# Patient Record
Sex: Male | Born: 1960 | Race: White | Hispanic: No | Smoking: Current every day smoker
Health system: Southern US, Community
[De-identification: ages and names within clinical notes are randomized; demographics above are authoritative.]

## PROBLEM LIST (undated history)

## (undated) ENCOUNTER — Emergency Department (HOSPITAL_BASED_OUTPATIENT_CLINIC_OR_DEPARTMENT_OTHER): Admission: EM | Payer: Non-veteran care | Source: Home / Self Care

## (undated) DIAGNOSIS — I10 Essential (primary) hypertension: Secondary | ICD-10-CM

## (undated) DIAGNOSIS — L509 Urticaria, unspecified: Secondary | ICD-10-CM

## (undated) HISTORY — DX: Urticaria, unspecified: L50.9

---

## 2016-01-30 ENCOUNTER — Emergency Department (HOSPITAL_BASED_OUTPATIENT_CLINIC_OR_DEPARTMENT_OTHER): Payer: Non-veteran care

## 2016-01-30 ENCOUNTER — Emergency Department (HOSPITAL_BASED_OUTPATIENT_CLINIC_OR_DEPARTMENT_OTHER)
Admission: EM | Admit: 2016-01-30 | Discharge: 2016-01-30 | Disposition: A | Discharge status: Home / Self Care | Payer: Non-veteran care | Attending: Emergency Medicine | Admitting: Emergency Medicine

## 2016-01-30 ENCOUNTER — Encounter (HOSPITAL_BASED_OUTPATIENT_CLINIC_OR_DEPARTMENT_OTHER): Payer: Self-pay | Admitting: *Deleted

## 2016-01-30 DIAGNOSIS — R0981 Nasal congestion: Secondary | ICD-10-CM

## 2016-01-30 DIAGNOSIS — R05 Cough: Secondary | ICD-10-CM | POA: Diagnosis present

## 2016-01-30 DIAGNOSIS — R059 Cough, unspecified: Secondary | ICD-10-CM

## 2016-01-30 DIAGNOSIS — I10 Essential (primary) hypertension: Secondary | ICD-10-CM | POA: Diagnosis not present

## 2016-01-30 DIAGNOSIS — R197 Diarrhea, unspecified: Secondary | ICD-10-CM | POA: Insufficient documentation

## 2016-01-30 DIAGNOSIS — R61 Generalized hyperhidrosis: Secondary | ICD-10-CM | POA: Insufficient documentation

## 2016-01-30 DIAGNOSIS — Z79899 Other long term (current) drug therapy: Secondary | ICD-10-CM | POA: Insufficient documentation

## 2016-01-30 DIAGNOSIS — J029 Acute pharyngitis, unspecified: Secondary | ICD-10-CM | POA: Diagnosis not present

## 2016-01-30 DIAGNOSIS — R42 Dizziness and giddiness: Secondary | ICD-10-CM | POA: Diagnosis not present

## 2016-01-30 HISTORY — DX: Essential (primary) hypertension: I10

## 2016-01-30 MED ORDER — BENZONATATE 100 MG PO CAPS
100.0000 mg | ORAL_CAPSULE | Freq: Once | ORAL | Status: AC
  Administered 2016-01-30: 100 mg via ORAL
  Filled 2016-01-30: qty 1

## 2016-01-30 MED ORDER — IBUPROFEN 800 MG PO TABS: 800.0000 mg | ORAL_TABLET | Freq: Three times a day (TID) | ORAL | Status: DC

## 2016-01-30 MED ORDER — BENZONATATE 100 MG PO CAPS: 100.0000 mg | ORAL_CAPSULE | Freq: Three times a day (TID) | ORAL | Status: DC

## 2016-01-30 MED ORDER — IBUPROFEN 800 MG PO TABS
800.0000 mg | ORAL_TABLET | Freq: Once | ORAL | Status: AC
  Administered 2016-01-30: 800 mg via ORAL
  Filled 2016-01-30: qty 1

## 2016-01-30 MED FILL — IBUPROFEN 800 MG TABLET: 800 | 7 days supply | Qty: 21 | Fill #0

## 2016-01-30 MED FILL — BENZONATATE 100 MG CAPSULE: 100 | 7 days supply | Qty: 21 | Fill #0

## 2016-01-30 NOTE — ED Provider Notes (Signed)
CSN: [id number]     Arrival date & time 01/30/16  3086 History   First MD Initiated Contact with Patient 01/30/16 0933     No chief complaint on file.    (Consider location/radiation/quality/duration/timing/severity/associated sxs/prior Treatment) HPI   Chase Romero is a 55 y.o. male, patient with no pertinent past medical history, presenting to the ED with productive cough with green and yellow sputum, sinus congestion, and sore throat for the last 2-3 days. Patient also endorses subjective fever, intermittent diarrhea, and momentary dizziness with coughing. Patient denies taking any medications for his symptoms. Later in the interview, patient then adds that he has had cough and congestion since October 2016. Patient adds that his coughing seems to get worse when he drinks alcohol. Patient denies nausea/vomiting, chest pain, shortness of breath, abdominal pain, or any other complaints.    Past Medical History  Diagnosis Date  . Hypertension    History reviewed. No pertinent past surgical history. No family history on file. Social History  Substance Use Topics  . Smoking status: Never Smoker   . Smokeless tobacco: None  . Alcohol Use: None    Review of Systems  Constitutional: Positive for fever (subjective) and diaphoresis. Negative for chills.  Respiratory: Positive for cough. Negative for shortness of breath.   Cardiovascular: Negative for chest pain.  Gastrointestinal: Positive for diarrhea. Negative for nausea, vomiting, abdominal pain and blood in stool.  Genitourinary: Negative for dysuria and hematuria.  Musculoskeletal: Negative for myalgias.  Skin: Negative for color change and pallor.  All other systems reviewed and are negative.     Allergies  Review of patient's allergies indicates no known allergies.  Home Medications   Prior to Admission medications   Medication Sig Start Date End Date Taking? Authorizing Provider  lisinopril (PRINIVIL,ZESTRIL) 20 MG  tablet Take 20 mg by mouth daily.   Yes Historical Provider, MD  benzonatate (TESSALON) 100 MG capsule Take 1 capsule (100 mg total) by mouth every 8 (eight) hours. 01/30/16   Jenay Morici C Brody Kump, PA-C  ibuprofen (ADVIL,MOTRIN) 800 MG tablet Take 1 tablet (800 mg total) by mouth 3 (three) times daily. 01/30/16   Osbaldo Mark C Donatella Walski, PA-C   BP 143/103 mmHg  Pulse 76  Temp(Src) 97.8 F (36.6 C) (Oral)  Resp 16  Ht  (1.778 m)  Wt 104.327 kg  BMI 33.00 kg/m2  SpO2 96% Physical Exam  Constitutional: He appears well-developed and well-nourished. No distress.  HENT:  Head: Normocephalic and atraumatic.  Mouth/Throat: Uvula is midline, oropharynx is clear and moist and mucous membranes are normal. No oropharyngeal exudate, posterior oropharyngeal edema, posterior oropharyngeal erythema or tonsillar abscesses.  Eyes: Conjunctivae are normal. Pupils are equal, round, and reactive to light.  Neck: Neck supple.  Cardiovascular: Normal rate, regular rhythm, normal heart sounds and intact distal pulses.   Pulmonary/Chest: Effort normal and breath sounds normal. No respiratory distress.  Abdominal: Soft. Bowel sounds are normal. There is no tenderness. There is no guarding.  Musculoskeletal: He exhibits no edema or tenderness.  Lymphadenopathy:    He has no cervical adenopathy.  Neurological: He is alert.  Skin: Skin is warm and dry. He is not diaphoretic.  Psychiatric: He has a normal mood and affect. His behavior is normal.  Nursing note and vitals reviewed.   ED Course  Procedures (including critical care time)  Imaging Review Dg Chest 2 View  01/30/2016  CLINICAL DATA:  Sweats and possible fever for 4 days. Cough since October, 2016. Initial encounter. EXAM:  CHEST  2 VIEW COMPARISON:  None. FINDINGS: The lungs are clear. Heart size is normal. There is no pneumothorax or pleural effusion. Thoracic spondylosis is noted. IMPRESSION: No acute disease. Electronically Signed   By: Drusilla Kannerhomas  Dalessio M.D.   On:  01/30/2016 10:15   I have personally reviewed and evaluated these images as part of my medical decision-making.   EKG Interpretation None      Orthostatic VS for the past 24 hrs:  BP- Lying Pulse- Lying BP- Sitting Pulse- Sitting BP- Standing at 0 minutes Pulse- Standing at 0 minutes  01/30/16 0959 (!) 122/98 mmHg 72 (!) 135/96 mmHg 80 125/86 mmHg 84      MDM   Final diagnoses:  Cough  Sinus congestion  Sore throat    Chase Romero presents with productive cough, congestion, sore throat, and subjective fever for the last 2-3 days.  Patient's presentation and symptoms are consistent with viral illness, such as influenza or an upper respiratory infection, exacerbated by seasonal allergies. Patient has no signs of sepsis or other serious illness. Patient has no red flag symptoms. No orthostasis. Patient to follow up with PCP should symptoms continue. Home care and return precautions discussed. Patient voiced understanding of these instructions and is comfortable with discharge.   Filed Vitals:   01/30/16 0932  BP: 143/103  Pulse: 76  Temp: 97.8 F (36.6 C)  TempSrc: Oral  Resp: 16  Height: 5\' 10"  (1.778 m)  Weight: 104.327 kg  SpO2: 96%      Anselm PancoastShawn C Lanita Stammen, PA-C 01/30/16 1706  Geoffery Lyonsouglas Delo, MD 01/31/16 [phone]

## 2016-01-30 NOTE — ED Notes (Signed)
C/o cough with green sputum since Friday with sweats and congestion  In head with sorethroat.

## 2016-01-30 NOTE — ED Notes (Signed)
Patient asked to change into gown. 

## 2016-01-30 NOTE — ED Notes (Signed)
Patient ambulates without difficulty or needing assistance to and from radiology department.

## 2016-01-30 NOTE — Discharge Instructions (Signed)
You have been seen today for cough, congestion, and sore throat. Your imaging showed no abnormalities. Your symptoms are consistent with a viral illness. Viruses do not require antibiotics. Treatment is symptomatic care. Drink plenty of fluids and get plenty of rest. You should be drinking at least a liter of water an hour to stay hydrated. Ibuprofen or Tylenol for pain or fever. Tessalon for cough. Plain Mucinex may help relieve congestion. Warm liquids or Chloraseptic spray may help soothe the sore throat. Follow up with PCP as needed. Return to ED should symptoms worsen.

## 2018-07-23 ENCOUNTER — Other Ambulatory Visit: Payer: Self-pay

## 2018-07-23 ENCOUNTER — Encounter (HOSPITAL_BASED_OUTPATIENT_CLINIC_OR_DEPARTMENT_OTHER): Payer: Self-pay | Admitting: *Deleted

## 2018-07-23 ENCOUNTER — Emergency Department (HOSPITAL_BASED_OUTPATIENT_CLINIC_OR_DEPARTMENT_OTHER)
Admission: EM | Admit: 2018-07-23 | Discharge: 2018-07-23 | Disposition: A | Discharge status: Home / Self Care | Payer: Non-veteran care | Attending: Emergency Medicine | Admitting: Emergency Medicine

## 2018-07-23 DIAGNOSIS — F17210 Nicotine dependence, cigarettes, uncomplicated: Secondary | ICD-10-CM | POA: Insufficient documentation

## 2018-07-23 DIAGNOSIS — Y929 Unspecified place or not applicable: Secondary | ICD-10-CM | POA: Insufficient documentation

## 2018-07-23 DIAGNOSIS — Y990 Civilian activity done for income or pay: Secondary | ICD-10-CM | POA: Insufficient documentation

## 2018-07-23 DIAGNOSIS — S0993XA Unspecified injury of face, initial encounter: Secondary | ICD-10-CM | POA: Diagnosis present

## 2018-07-23 DIAGNOSIS — S0502XA Injury of conjunctiva and corneal abrasion without foreign body, left eye, initial encounter: Secondary | ICD-10-CM | POA: Diagnosis not present

## 2018-07-23 DIAGNOSIS — I10 Essential (primary) hypertension: Secondary | ICD-10-CM | POA: Diagnosis not present

## 2018-07-23 DIAGNOSIS — W228XXA Striking against or struck by other objects, initial encounter: Secondary | ICD-10-CM | POA: Diagnosis not present

## 2018-07-23 DIAGNOSIS — Y939 Activity, unspecified: Secondary | ICD-10-CM | POA: Insufficient documentation

## 2018-07-23 DIAGNOSIS — Z79899 Other long term (current) drug therapy: Secondary | ICD-10-CM | POA: Insufficient documentation

## 2018-07-23 MED ORDER — CIPROFLOXACIN HCL 0.3 % OP SOLN: 2.0000 [drp] | OPHTHALMIC | 0 refills | Status: DC

## 2018-07-23 MED ORDER — TETRACAINE HCL 0.5 % OP SOLN
2.0000 [drp] | Freq: Once | OPHTHALMIC | Status: AC
  Administered 2018-07-23: 2 [drp] via OPHTHALMIC
  Filled 2018-07-23: qty 4

## 2018-07-23 MED ORDER — FLUORESCEIN SODIUM 1 MG OP STRP
1.0000 | ORAL_STRIP | Freq: Once | OPHTHALMIC | Status: AC
  Administered 2018-07-23: 1 via OPHTHALMIC
  Filled 2018-07-23: qty 1

## 2018-07-23 NOTE — ED Triage Notes (Signed)
Pt reports he got something in his eye while working today. Thinks it may be sawdust or metal shavings. C/o left eye pain and fb sensation since 5pm

## 2018-07-23 NOTE — ED Provider Notes (Signed)
[facility] EMERGENCY DEPARTMENT Provider Note   CSN: [id number] Arrival date & time: 07/23/18  0005     History   Chief Complaint Chief Complaint  Patient presents with  . Eye Pain    HPI Chase Romero is a 57 y.o. male.  The history is provided by the patient.  Eye Pain  This is a new problem. The current episode started 12 to 24 hours ago. The problem occurs constantly. The problem has not changed since onset.Pertinent negatives include no chest pain, no abdominal pain, no headaches and no shortness of breath. Nothing aggravates the symptoms. Nothing relieves the symptoms. He has tried nothing for the symptoms. The treatment provided no relief.  Something flew into his left eye at work.  Has flushed the eye with water but symptoms persist.  No f/c/r. No swelling no discharge.    Past Medical History:  Diagnosis Date  . Hypertension     There are no active problems to display for this patient.   History reviewed. No pertinent surgical history.      Home Medications    Prior to Admission medications   Medication Sig Start Date End Date Taking? Authorizing Provider  benzonatate (TESSALON) 100 MG capsule Take 1 capsule (100 mg total) by mouth every 8 (eight) hours. 01/30/16   Joy, Shawn C, PA-C  ciprofloxacin (CILOXAN) 0.3 % ophthalmic solution Place 2 drops into the left eye every 4 (four) hours while awake. Administer 1 drop, every 2 hours, while awake, for 2 days. Then 1 drop, every 4 hours, while awake, for the next 5 days. 07/23/18   Ailen Strauch, MD  ibuprofen (ADVIL,MOTRIN) 800 MG tablet Take 1 tablet (800 mg total) by mouth 3 (three) times daily. 01/30/16   Joy, Shawn C, PA-C  lisinopril (PRINIVIL,ZESTRIL) 20 MG tablet Take 20 mg by mouth daily.    [provider]    Family History No family history on file.  Social History Social History   Tobacco Use  . Smoking status: Current Every Day Smoker    Types: Cigarettes  . Smokeless  tobacco: Never Used  Substance Use Topics  . Alcohol use: Never    Frequency: Never  . Drug use: Never     Allergies   Patient has no known allergies.   Review of Systems Review of Systems  Constitutional: Negative for fever.  Eyes: Positive for pain, redness and itching. Negative for visual disturbance.  Respiratory: Negative for shortness of breath.   Cardiovascular: Negative for chest pain.  Gastrointestinal: Negative for abdominal pain.  Genitourinary: Negative for flank pain.  Neurological: Negative for headaches.  All other systems reviewed and are negative.    Physical Exam Updated Vital Signs BP (!) 139/93 (BP Location: Left Arm)   Pulse 90   Temp 97.9 F (36.6 C) (Oral)   Resp 16   Ht 5\' 10"  (1.778 m)   Wt 102.1 kg   SpO2 99%   BMI 32.28 kg/m   Physical Exam  Constitutional: He is oriented to person, place, and time. He appears well-developed and well-nourished. No distress.  HENT:  Head: Normocephalic and atraumatic.  Eyes: Pupils are equal, round, and reactive to light. EOM are normal. Left eye exhibits no chemosis, no discharge and no exudate. Left conjunctiva is injected.    Neck: Normal range of motion. Neck supple.  Cardiovascular: Normal rate, regular rhythm, normal heart sounds and intact distal pulses.  Pulmonary/Chest: Effort normal and breath sounds normal. No stridor. He has no  wheezes. He has no rales.  Abdominal: Soft. Bowel sounds are normal.  Musculoskeletal: Normal range of motion.  Neurological: He is alert and oriented to person, place, and time.  Skin: Skin is warm and dry. Capillary refill takes less than 2 seconds.     ED Treatments / Results  Labs (all labs ordered are listed, but only abnormal results are displayed) Labs Reviewed - No data to display  EKG None  Radiology No results found.  Procedures Procedures (including critical care time)  Medications Ordered in ED Medications  fluorescein ophthalmic strip 1  strip (1 strip Right Eye Given by Other 07/23/18 0024)  tetracaine (PONTOCAINE) 0.5 % ophthalmic solution 2 drop (2 drops Right Eye Given by Other 07/23/18 0025)       Visual Acuity  Right Eye Distance: 20/25 Left Eye Distance: 20/30 Bilateral Distance: 20/25  Right Eye Near:   Left Eye Near:    Bilateral Near:     Final Clinical Impressions(s) / ED Diagnoses   Final diagnoses:  Abrasion of left cornea, initial encounter    Return for weakness, numbness, changes in vision or speech, fevers >100.4 unrelieved by medication, shortness of breath, intractable vomiting, or diarrhea, abdominal pain, Inability to tolerate liquids or food, cough, altered mental status or any concerns. No signs of systemic illness or infection. The patient is nontoxic-appearing on exam and vital signs are within normal limits.    I have reviewed the triage vital signs and the nursing notes. Pertinent labs &imaging results that were available during my care of the patient were reviewed by me and considered in my medical decision making (see chart for details).  After history, exam, and medical workup I feel the patient has been appropriately medically screened and is safe for discharge home. Pertinent diagnoses were discussed with the patient. Patient was given return precautions. ED Discharge Orders         Ordered    ciprofloxacin (CILOXAN) 0.3 % ophthalmic solution  Every 4 hours while awake     07/23/18 0025           Nechama Escutia, MD 07/23/18 1610

## 2018-07-23 NOTE — ED Notes (Signed)
ED Provider at bedside. 

## 2020-09-28 ENCOUNTER — Encounter (HOSPITAL_COMMUNITY): Payer: Self-pay | Admitting: Emergency Medicine

## 2020-09-28 ENCOUNTER — Ambulatory Visit (INDEPENDENT_AMBULATORY_CARE_PROVIDER_SITE_OTHER): Payer: Non-veteran care

## 2020-09-28 ENCOUNTER — Ambulatory Visit (HOSPITAL_COMMUNITY)
Admission: EM | Admit: 2020-09-28 | Discharge: 2020-09-28 | Disposition: A | Discharge status: Home / Self Care | Payer: Non-veteran care | Attending: Emergency Medicine | Admitting: Emergency Medicine

## 2020-09-28 DIAGNOSIS — M5441 Lumbago with sciatica, right side: Secondary | ICD-10-CM

## 2020-09-28 DIAGNOSIS — R202 Paresthesia of skin: Secondary | ICD-10-CM | POA: Diagnosis not present

## 2020-09-28 DIAGNOSIS — M549 Dorsalgia, unspecified: Secondary | ICD-10-CM

## 2020-09-28 DIAGNOSIS — M25551 Pain in right hip: Secondary | ICD-10-CM

## 2020-09-28 DIAGNOSIS — M5136 Other intervertebral disc degeneration, lumbar region: Secondary | ICD-10-CM

## 2020-09-28 MED ORDER — PREDNISONE 20 MG PO TABS
ORAL_TABLET | ORAL | Status: AC
Start: 2020-09-28 — End: ?
  Filled 2020-09-28: qty 3

## 2020-09-28 MED ORDER — PREDNISONE 20 MG PO TABS
60.0000 mg | ORAL_TABLET | Freq: Once | ORAL | Status: AC
  Administered 2020-09-28: 60 mg via ORAL

## 2020-09-28 MED ORDER — IBUPROFEN 600 MG PO TABS: 600.0000 mg | ORAL_TABLET | Freq: Four times a day (QID) | ORAL | 0 refills | Status: DC | PRN

## 2020-09-28 MED ORDER — ACETAMINOPHEN 325 MG PO TABS
975.0000 mg | ORAL_TABLET | Freq: Once | ORAL | Status: AC
  Administered 2020-09-28: 975 mg via ORAL

## 2020-09-28 MED ORDER — KETOROLAC TROMETHAMINE 30 MG/ML IJ SOLN
30.0000 mg | Freq: Once | INTRAMUSCULAR | Status: AC
  Administered 2020-09-28: 30 mg via INTRAMUSCULAR

## 2020-09-28 MED ORDER — TIZANIDINE HCL 4 MG PO TABS: 4.0000 mg | ORAL_TABLET | Freq: Three times a day (TID) | ORAL | 0 refills | Status: DC | PRN

## 2020-09-28 MED ORDER — PREDNISONE 10 MG (21) PO TBPK: ORAL_TABLET | ORAL | 0 refills | Status: DC

## 2020-09-28 MED ORDER — ACETAMINOPHEN 325 MG PO TABS
ORAL_TABLET | ORAL | Status: AC
Start: 2020-09-28 — End: ?
  Filled 2020-09-28: qty 3

## 2020-09-28 MED ORDER — KETOROLAC TROMETHAMINE 30 MG/ML IJ SOLN
INTRAMUSCULAR | Status: AC
Start: 2020-09-28 — End: ?
  Filled 2020-09-28: qty 1

## 2020-09-28 NOTE — ED Provider Notes (Signed)
HPI  SUBJECTIVE:  Chase Romero is a 59 y.o. male who presents with right hip/low back pain starting about 6 weeks ago.  States that he was getting out of his car rapidly and felt a "pop" not sure if it was in his hip or back.  He states that the pain is sharp, constant, he reports muscle spasms.  He states that the pain radiates from his back down his buttock and posterior leg down to the calf.  States that the pain is getting worse over the past few weeks he states this feels similar to the sciatica that he had on his left side in 2012.  Denies saddle anesthesia, urinary, fecal incontinence, urinary retention, bilateral radicular pain, fevers, unintentional weight loss, pain worse at night.  Reports right leg weakness secondary to pain.  He tried elevating his hips and legs without improvement in his symptoms.  He has also tried Flexeril Tylenol.  Lying on his left side, Flexeril, sitting flexed forward on his left side seems to help.  Symptoms worse with walking, standing.  He does a lot of heavy lifting at work.  He has a past medical history of hypertension, left-sided sciatica.  No history of osteoporosis, prolonged steroid use, chronic kidney disease, cancer, multiple myeloma, diabetes, IVDU.  PMD: In [place]. he lives in [place].   Past Medical History:  Diagnosis Date  . Hypertension     History reviewed. No pertinent surgical history.  Family History  Problem Relation Age of Onset  . Healthy Mother   . CAD Father   . Heart failure Father     Social History   Tobacco Use  . Smoking status: Current Every Day Smoker    Packs/day: 0.25    Types: Cigarettes  . Smokeless tobacco: Never Used  Vaping Use  . Vaping Use: Never used  Substance Use Topics  . Alcohol use: Not Currently  . Drug use: Never    No current facility-administered medications for this encounter.  Current Outpatient Medications:  .  hydrochlorothiazide (HYDRODIURIL) 25 MG tablet, Take 25 mg by mouth  daily., Disp: , Rfl:  .  losartan (COZAAR) 100 MG tablet, Take 100 mg by mouth daily., Disp: , Rfl:  .  ciprofloxacin (CILOXAN) 0.3 % ophthalmic solution, Place 2 drops into the left eye every 4 (four) hours while awake. Administer 1 drop, every 2 hours, while awake, for 2 days. Then 1 drop, every 4 hours, while awake, for the next 5 days., Disp: 5 mL, Rfl: 0 .  ibuprofen (ADVIL) 600 MG tablet, Take 1 tablet (600 mg total) by mouth every 6 (six) hours as needed., Disp: 30 tablet, Rfl: 0 .  lisinopril (PRINIVIL,ZESTRIL) 20 MG tablet, Take 20 mg by mouth daily., Disp: , Rfl:  .  predniSONE (STERAPRED UNI-PAK 21 TAB) 10 MG (21) TBPK tablet, Dispense one 6 day pack. Take as directed with food., Disp: 21 tablet, Rfl: 0 .  tiZANidine (ZANAFLEX) 4 MG tablet, Take 1 tablet (4 mg total) by mouth every 8 (eight) hours as needed for muscle spasms., Disp: 30 tablet, Rfl: 0  No Known Allergies   ROS  As noted in HPI.   Physical Exam  BP (!) 146/97 (BP Location: Right Arm)   Pulse 81   Temp (!) 97.4 F (36.3 C) (Oral)   Resp 20   SpO2 100%   Constitutional: Well developed, well nourished, appears uncomfortable.  Requires a cane to walk. Eyes:  EOMI, conjunctiva normal bilaterally HENT: Normocephalic, atraumatic,mucus membranes  moist Respiratory: Normal inspiratory effort Cardiovascular: Normal rate GI: nondistended. No suprapubic tenderness skin: No rash, skin intact Musculoskeletal: no CVAT. + Right paralumbar and QL tenderness, +  muscle spasm.  Tenderness over sciatic notch, diffuse tenderness over right buttock and down lateral thigh.  No L-spine SI joint tenderness.  Pain with internal/external rotation, AB/adduction right hip.  No pain with flexion extension of the right hip.  Left hip range of motion normal.  SLR neg bilaterally. Sensation baseline light touch bilaterally for Pt, DTR's symmetric and intact bilaterally KJ, Motor symmetric bilateral 5/5 hip flexion, quadriceps, hamstrings,   foot dorsiflexion, foot plantarflexion, gait antalgic but without apparent new ataxia. Neurologic: Alert & oriented x 3, no focal neuro deficits Psychiatric: Speech and behavior appropriate   ED Course   Medications  acetaminophen (TYLENOL) tablet 975 mg (975 mg Oral Given 09/28/20 2124)  ketorolac (TORADOL) 30 MG/ML injection 30 mg (30 mg Intramuscular Given 09/28/20 2124)  predniSONE (DELTASONE) tablet 60 mg (60 mg Oral Given 09/28/20 2123)    Orders Placed This Encounter  Procedures  . DG Lumbar Spine Complete    Standing Status:   Standing    Number of Occurrences:   1    Order Specific Question:   Reason for Exam (SYMPTOM  OR DIAGNOSIS REQUIRED)    Answer:   R LBP  . DG Hip Unilat With Pelvis 2-3 Views Right    Standing Status:   Standing    Number of Occurrences:   1    Order Specific Question:   Symptom/Reason for Exam    Answer:   Hip pain, right [[id number]]  . Ambulatory referral to Neurosurgery    Referral Priority:   Urgent    Referral Type:   Surgical    Referral Reason:   Specialty Services Required    Requested Specialty:   Neurosurgery    Number of Visits Requested:   1    No results found for this or any previous visit (from the past 24 hour(s)). DG Lumbar Spine Complete  Result Date: 09/28/2020 CLINICAL DATA:  Low back pain, right lower extremity paresthesia EXAM: LUMBAR SPINE - COMPLETE 4+ VIEW COMPARISON:  None. FINDINGS: Five view radiograph lumbar spine. Five non rib bearing segments of the lumbar spine. No acute fracture or listhesis of the lumbar spine. Vertebral body height has been preserved. There is intervertebral disc space narrowing and endplate remodeling of E4-V4, most severe at L4-5, in keeping with changes of moderate to severe degenerative disc disease. Remaining intervertebral disc heights are preserved. Multiple calculi overlie the expected lower pole of the left kidney measuring up to 15 mm in conglomerate diameter. Additionally, a rounded  calcification is seen within the left paraspinal region anterolateral to L3 measuring 11 mm x 15 mm x 23 mm suspicious for a ureteral calculus. IMPRESSION: Advanced degenerative disc disease of the lower lumbar spine, most severe at L4-5. Left nephro and urolithiasis. This could be confirmed with CT imaging. Electronically Signed   By: Fidela Salisbury MD   On: 09/28/2020 21:33   DG Hip Unilat With Pelvis 2-3 Views Right  Result Date: 09/28/2020 CLINICAL DATA:  Right hip pain EXAM: DG HIP (WITH OR WITHOUT PELVIS) 2-3V RIGHT COMPARISON:  None. FINDINGS: Single view radiograph of the pelvis and two view radiograph of the right hip demonstrates normal alignment. No fracture or dislocation. Mild right hip degenerative arthritis. Left nephro and urolithiasis is noted with a 25 mm by 12 mm calculus in the expected location of  the left ureter lateral to L3. IMPRESSION: No fracture or dislocation. Left nephro and urolithiasis. This could be confirmed with CT imaging. Electronically Signed   By: Fidela Salisbury MD   On: 09/28/2020 21:34    ED Clinical Impression  1. Degenerative disc disease, lumbar   2. Hip pain, right   3. Acute right-sided low back pain with right-sided sciatica     ED Assessment/Plan   Pain has been going on for 6 weeks.  He states that the pain started in the front, and has now migrated to the back, suggesting hip pathology.  Will image both hip and L-spine.  Will give Toradol 30 mg IM x1, Tylenol 975 mg p.o., prednisone 60 mg p.o. x1.  Reviewed imaging independently.  Moderate to severe degenerative disc disease most severe at L4/L5.  No compression fracture.  Mild right hip osteoarthritis.  Left nephro and left urolithiasis noted.  See radiology report for details.  Patient has no pain on the left side, no urinary complaints.  He states that he already knew about the urolithiasis on the left side.    Pt with significant reduction in pain after pain meds. No new findings on  re-exam. Pt ambulatory in the UC.  Will send home with Tylenol/ibuprofen, Zanaflex, 6-day prednisone taper.  Have ordered urgent referral to [place] [facility].  Pt to f/u with [facility] ASAP.  Discussed imaging, medical decision-making, and plan for follow-up with the patient.  Discussed signs and symptoms that should prompt return to the emergency department.  Patient agrees with plan.   Meds ordered this encounter  Medications  . acetaminophen (TYLENOL) tablet 975 mg  . ketorolac (TORADOL) 30 MG/ML injection 30 mg  . predniSONE (DELTASONE) tablet 60 mg  . ibuprofen (ADVIL) 600 MG tablet    Sig: Take 1 tablet (600 mg total) by mouth every 6 (six) hours as needed.    Dispense:  30 tablet    Refill:  0  . tiZANidine (ZANAFLEX) 4 MG tablet    Sig: Take 1 tablet (4 mg total) by mouth every 8 (eight) hours as needed for muscle spasms.    Dispense:  30 tablet    Refill:  0  . predniSONE (STERAPRED UNI-PAK 21 TAB) 10 MG (21) TBPK tablet    Sig: Dispense one 6 day pack. Take as directed with food.    Dispense:  21 tablet    Refill:  0    *This clinic note was created using [employer]. Therefore, there may be occasional mistakes despite careful proofreading.  ?     Melynda Ripple, MD 09/29/20 1052

## 2020-09-28 NOTE — Discharge Instructions (Addendum)
I suspect that you have had an acute worsening of your degenerative disc disease which is now causing compression on your sciatic nerve.  Take 600 mg of ibuprofen combined with 1000 mg of Tylenol together 3-4 times a day as needed for pain.  Finish the steroid taper unless a provider tells you to stop.  I have ordered a neurosurgery referral, I think you need to be seen by them sooner rather than later.  Call [facility] to arrange an appointment ASAP.  Go immediately to the emergency department for numbness between your thighs, urinary or fecal incontinence, urinary retention, leg weakness that is not due to pain, or for any other concerns

## 2020-09-28 NOTE — ED Triage Notes (Signed)
Pt c/o back pain on the right side radiating down to his hip and back of leg. Pt states he felt a pop about 6 weeks ago while working and thinks he may have pinched his sciatic nerve.

## 2021-08-14 IMAGING — DX DG HIP (WITH OR WITHOUT PELVIS) 2-3V*R*
3 series · 3 of 3 positions shown · non-contrast
Comparison: None.

CLINICAL DATA: Right hip pain

EXAM:
DG HIP (WITH OR WITHOUT PELVIS) 2-3V RIGHT

[pelvis ap]
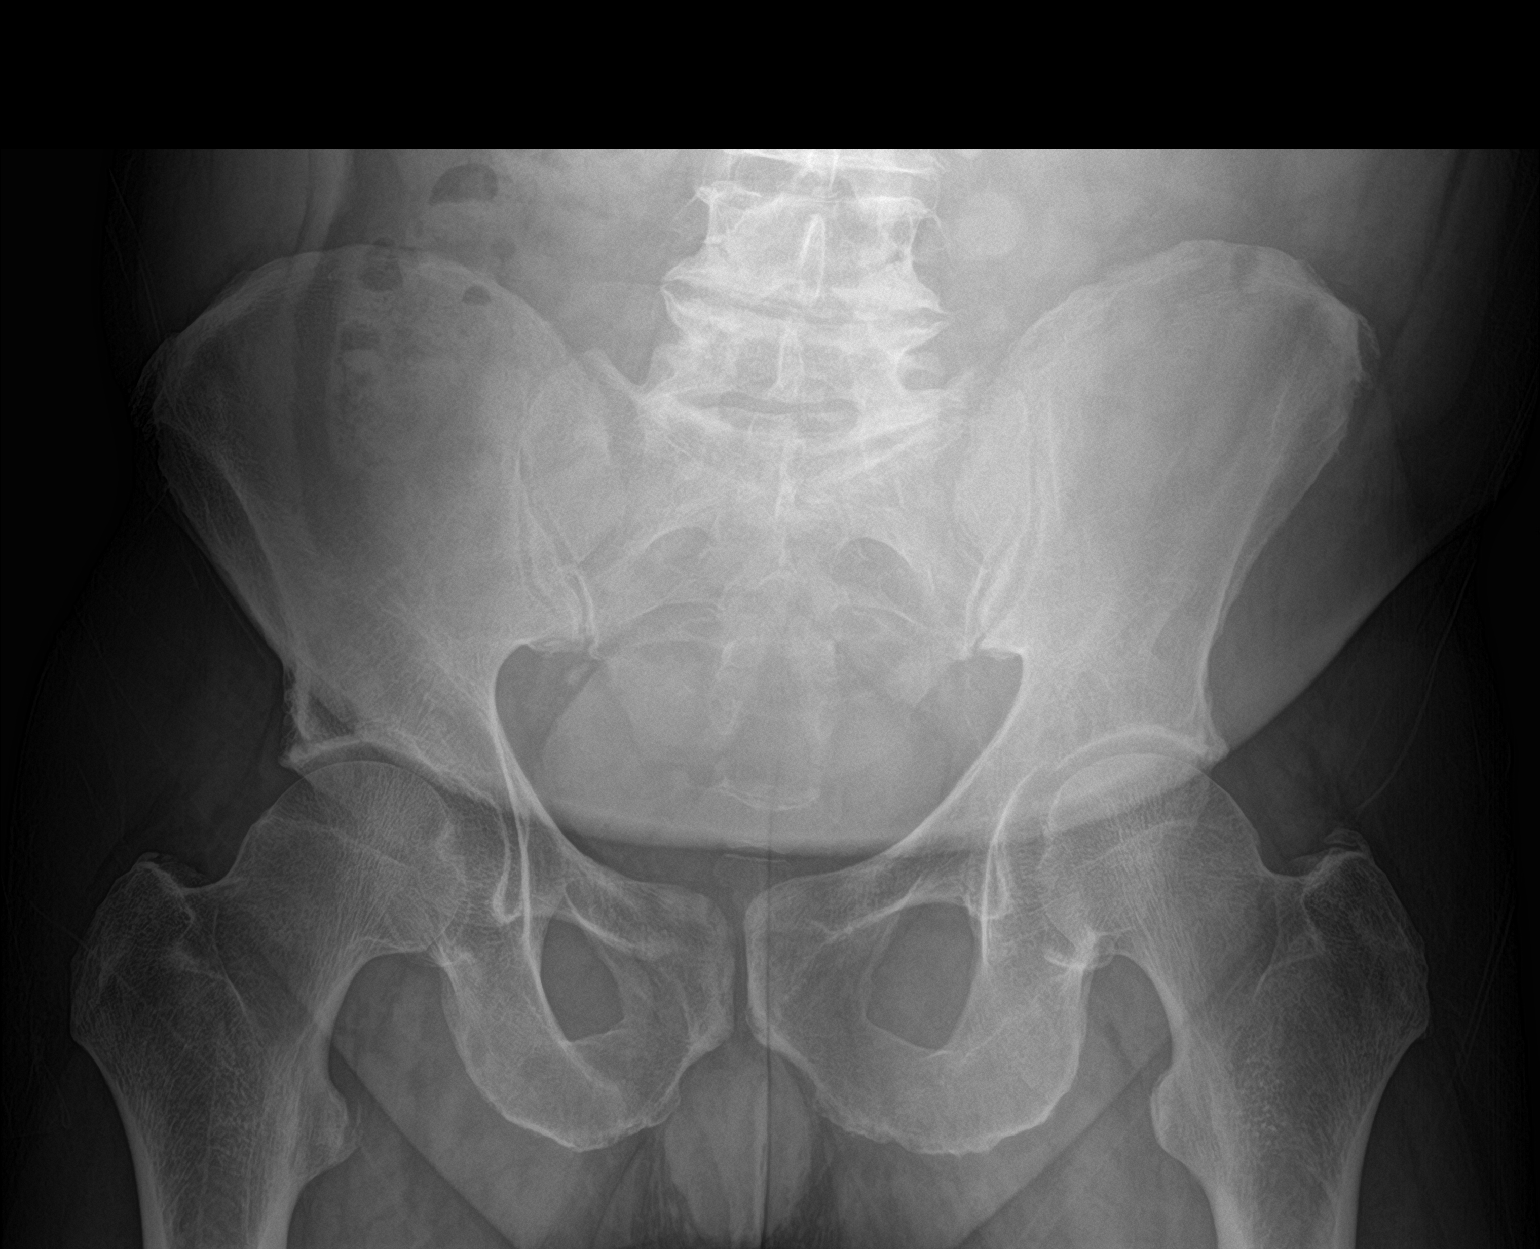

[hip ap]
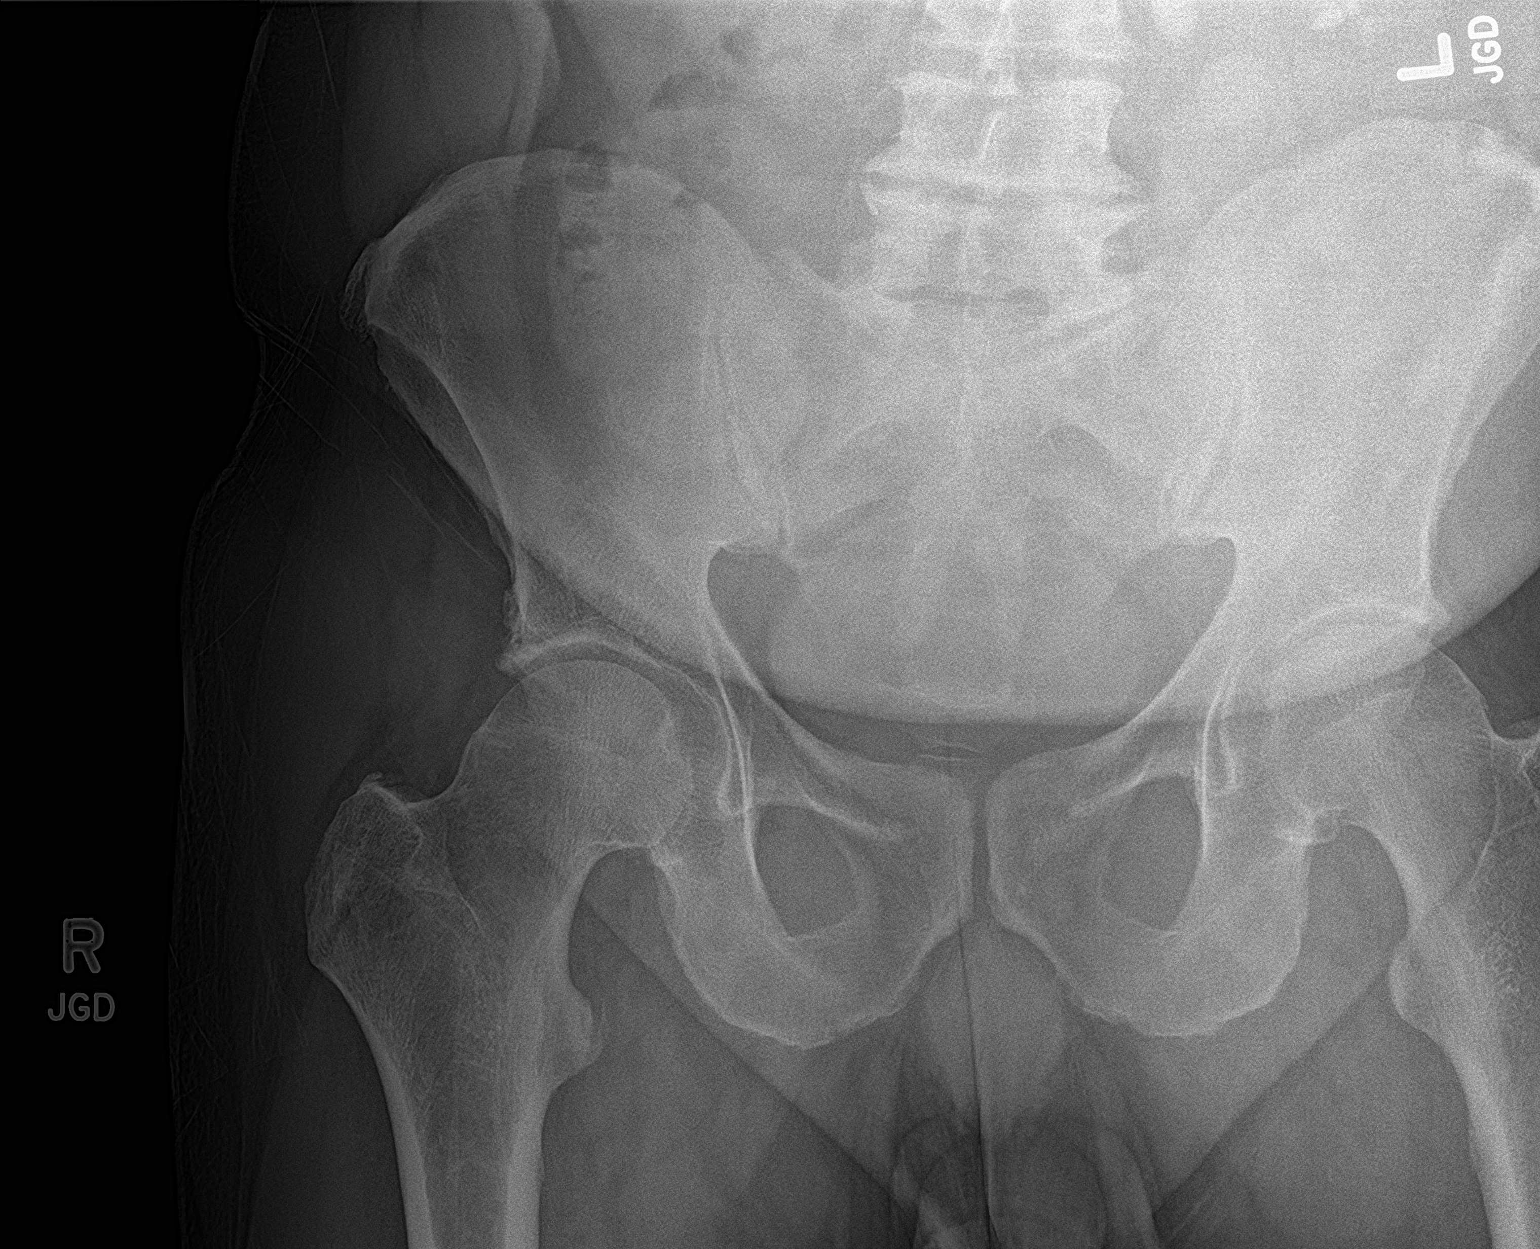

[hip lat]
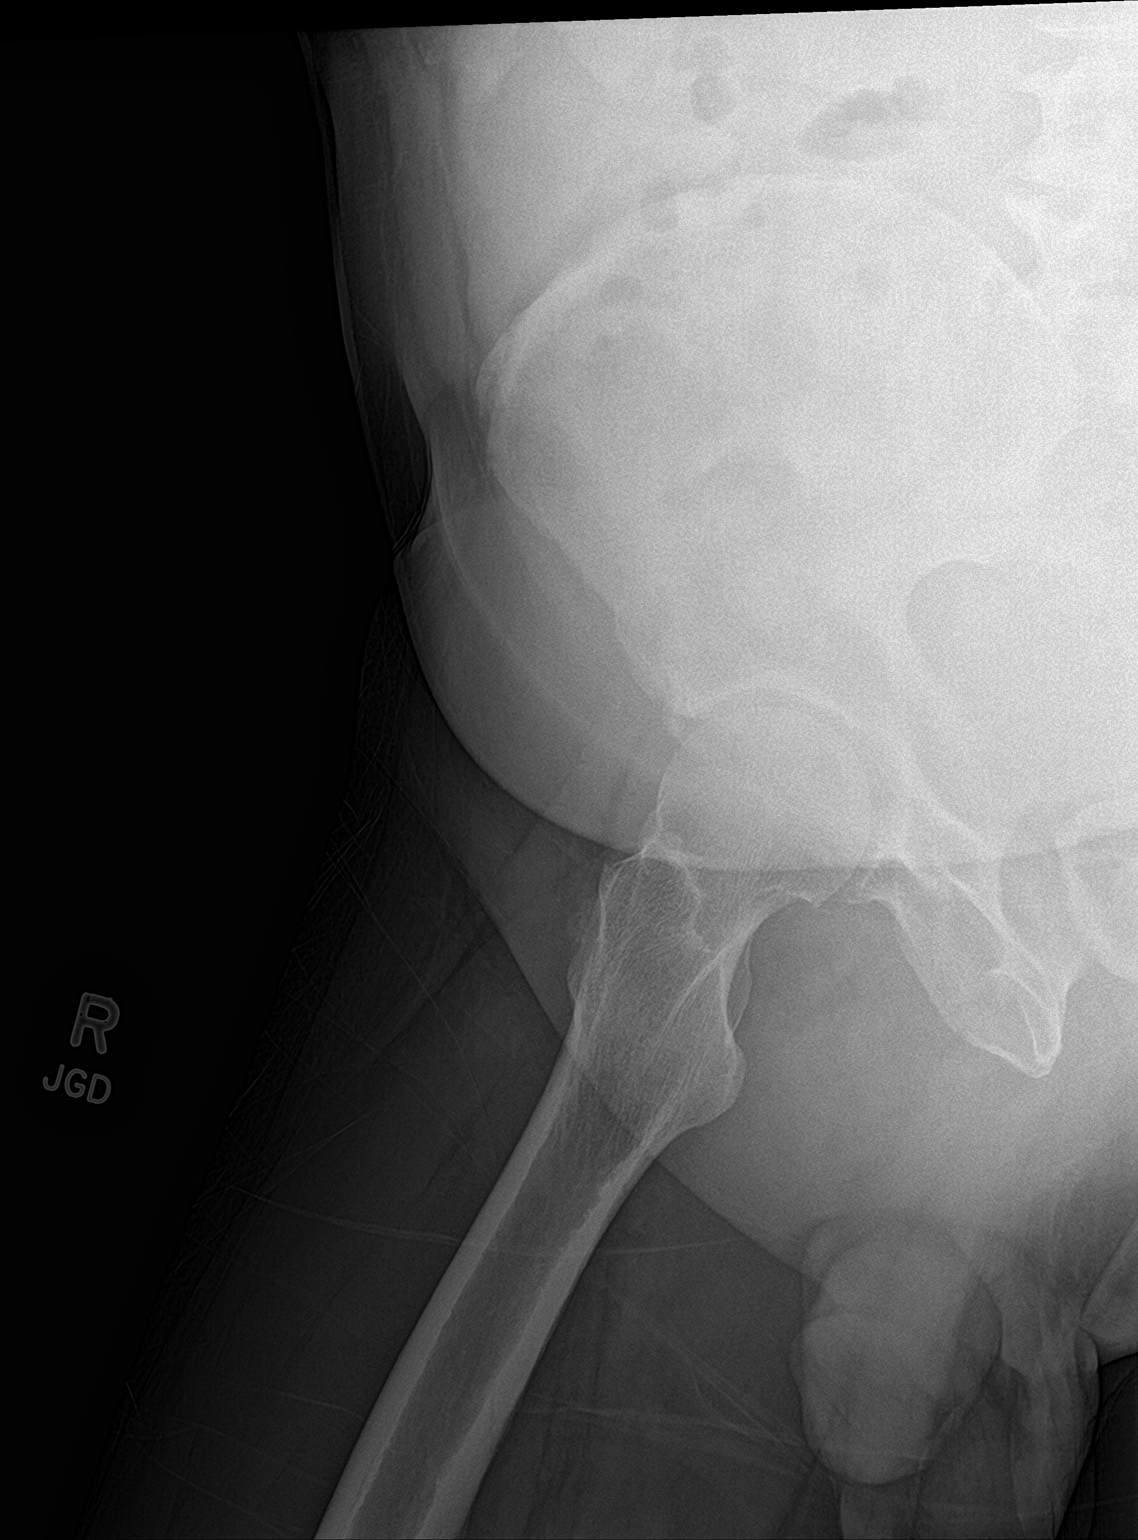

[3 of 3 positions shown; findings below may reference images not displayed]

FINDINGS: Single view radiograph of the pelvis and two view radiograph of the
right hip demonstrates normal alignment. No fracture or dislocation.
Mild right hip degenerative arthritis. Left nephro and urolithiasis
is noted with a 25 mm by 12 mm calculus in the expected location of
the left ureter lateral to L3.
IMPRESSION: No fracture or dislocation.

Left nephro and urolithiasis. This could be confirmed with CT
imaging.

## 2023-05-06 NOTE — Progress Notes (Unsigned)
New Patient Note  RE: Chase Romero MRN: [id number] DOB: Jul 08, 1961 Date of Office Visit: 05/07/2023  Consult requested by: Pearson Grippe, MD Primary care provider: [facility], [place]  Chief Complaint: Urticaria (scalp - scratches until he bloods / shoulder, near his stomach after the shingles shot )  History of Present Illness: I had the pleasure of seeing [name] for initial evaluation at the [facility] on 05/08/2023. He is a 62 y.o. male, who is referred here by Dr. Pearson Grippe (PCP) for the evaluation of rash.  Rash started about 8 months ago. Mainly occurs on his torso, scalp, thighs. Describes them as itchy, raised, red. Individual rashes lasts about a few weeks at a time. No ecchymosis upon resolution. Associated symptoms include: none.  Frequency of episodes: daily. Suspected triggers are unknown - but concerned about shingles vaccine as it's started 2 weeks after the first vaccine. He did get his second shingles vaccine. No other issues with other vaccines.   Denies any fevers, chills, foods, personal care products or recent infections. He has tried the following therapies: calamine lotion, steroid creams with minimal benefit. Systemic steroids: no. Currently on no daily antihistamines.  Previous work up includes: none. Previous history of rash/hives: no. Patient is up to date with the following cancer screening tests: physical exam, colonoscopy.  Assessment and Plan: Kramer is a 62 y.o. male with: Rash and other nonspecific skin eruption Daily pruritic rash x 8 month son the torso, scalp and thighs. Tried topical creams with minimal benefit. Denies any changes in diet, personal care products or recent infections. Concerned about shingles vaccine as it started 2 weeks after receiving but also go his second shingles vaccine with no flares. No issues with other vaccines. Concerned about allergic triggers. Today's skin prick testing was negative to  indoor/outdoor allergens and common foods. Unclear etiology. Keep track of flares and take pictures.  Take prednisone 40mg  daily x 2 days, 30mg  daily x 2 days, 20mg  daily x 2 days and 10mg  daily x 2 days. Start zyrtec (cetirizine) 10mg  twice a day. If symptoms are not controlled or causes drowsiness let us know. Start pepcid (famotidine) 20mg  twice a day.  Avoid the following potential triggers: alcohol, tight clothing, NSAIDs, hot showers and getting overheated. See below for proper skin care.  Use fragrance free and dye free products. Get bloodwork. If negative will recommend patch testing and dermatology referral next.   Chronic rhinitis Mild rhinitis symptoms in the spring. Today's skin prick testing was negative to indoor/outdoor allergens. Monitor symptoms.  Return in about 4 weeks (around 06/04/2023).  Meds ordered this encounter  Medications   cetirizine (ZYRTEC ALLERGY) 10 MG tablet    Sig: Take 1 tablet (10 mg total) by mouth 2 (two) times daily.    Dispense:  60 tablet    Refill:  2   famotidine (PEPCID) 20 MG tablet    Sig: Take 1 tablet (20 mg total) by mouth 2 (two) times daily.    Dispense:  60 tablet    Refill:  2   predniSONE (DELTASONE) 10 MG tablet    Sig: Take prednisone 40mg  daily x 2 days, 30mg  daily x 2 days, 20mg  daily x 2 days and 10mg  daily x 2 days.    Dispense:  20 tablet    Refill:  0   Lab Orders         Allergens w/Total IgE Area 2         Alpha-Gal Panel  ANA w/Reflex         C3 and C4         CBC with Differential/Platelet         Chronic Urticaria         Comprehensive metabolic panel         C-reactive protein         Protein Electrophoresis, Urine Rflx.         Protein electrophoresis, serum         Sedimentation rate         Tryptase         Thyroid Cascade Profile      Other allergy screening: Asthma: no Rhino conjunctivitis:  some rhinitis symptoms in the spring. No prior allergy testing.  Food allergy: no Medication  allergy:  Lisinopril caused headaches. Hymenoptera allergy: no Eczema:no Has some chronic fungal rash.  Saw dermatology in the past briefly and had a skin biopsy for a brown rash.  History of recurrent infections suggestive of immunodeficency: no  Diagnostics: Skin Testing: Environmental allergy panel and select foods. Negative to indoor/outdoor allergens and common foods. Results discussed with patient/family.  Airborne Adult Perc - 05/07/23 1108     Time Antigen Placed 1108    Allergen Manufacturer Waynette Buttery    Location Back    Number of Test 55    1. Control-Buffer 50% Glycerol Negative    2. Control-Histamine 3+    3. Bahia Negative    4. French Southern Territories Negative    5. Johnson Negative    6. Kentucky Blue Negative    7. Meadow Fescue Negative    8. Perennial Rye Negative    9. Timothy Negative    10. Ragweed Mix Negative    11. Cocklebur Negative    12. Plantain,  English Negative    13. Baccharis Negative    14. Dog Fennel Negative    15. Russian Thistle Negative    16. Lamb's Quarters Negative    17. Sheep Sorrell Negative    18. Rough Pigweed Negative    19. Marsh Elder, Rough Negative    20. Mugwort, Common Negative    21. Box, Elder Negative    22. Cedar, red Negative    23. Sweet Gum Negative    24. Pecan Pollen Negative    25. Pine Mix Negative    26. Walnut, Black Pollen Negative    27. Red Mulberry Negative    28. Ash Mix Negative    29. Birch Mix Negative    30. Beech American Negative    31. Cottonwood, Guinea-Bissau Negative    32. Hickory, White Negative    33. Maple Mix Negative    34. Oak, Guinea-[place]issau Mix Negative    35. Sycamore Eastern Negative    36. Alternaria Alternata Negative    37. Cladosporium Herbarum Negative    38. Aspergillus Mix Negative    39. Penicillium Mix Negative    40. Bipolaris Sorokiniana (Helminthosporium) Negative    41. Drechslera Spicifera (Curvularia) Negative    42. Mucor Plumbeus Negative    43. Fusarium Moniliforme Negative     44. Aureobasidium Pullulans (pullulara) Negative    45. Rhizopus Oryzae Negative    46. Botrytis Cinera Negative    47. Epicoccum Nigrum Negative    48. Phoma Betae Negative    49. Dust Mite Mix Negative    50. Cat Hair 10,000 BAU/ml Negative    51.  Dog Epithelia Negative    52. Mixed Feathers Negative  53. Horse Epithelia Negative    54. Cockroach, German Negative    55. Tobacco Leaf Negative             Food Adult Perc - 05/08/23 0800     Location Back    Number of allergen test 9     Control-buffer 50% Glycerol Omitted    Control-Histamine Omitted    1. Peanut Negative    2. Soybean Negative    3. Wheat Negative    4. Sesame Negative    5. Milk, Cow Negative    6. Casein Negative    7. Egg White, Chicken Negative    8. Shellfish Mix Negative    9. Fish Mix Negative             Past Medical History: Patient Active Problem List   Diagnosis Date Noted   Rash and other nonspecific skin eruption 05/08/2023   Dermatitis 05/08/2023   Chronic rhinitis 05/08/2023   Past Medical History:  Diagnosis Date   Hypertension    Urticaria    Past Surgical History: History reviewed. No pertinent surgical history. Medication List:  Current Outpatient Medications  Medication Sig Dispense Refill   betamethasone dipropionate 0.05 % cream Apply topically.     cetirizine (ZYRTEC ALLERGY) 10 MG tablet Take 1 tablet (10 mg total) by mouth 2 (two) times daily. 60 tablet 2   cyclobenzaprine (FLEXERIL) 10 MG tablet Take by mouth.     famotidine (PEPCID) 20 MG tablet Take 1 tablet (20 mg total) by mouth 2 (two) times daily. 60 tablet 2   hydrochlorothiazide (HYDRODIURIL) 25 MG tablet Take 25 mg by mouth daily.     ibuprofen (ADVIL) 600 MG tablet Take 1 tablet (600 mg total) by mouth every 6 (six) hours as needed. 30 tablet 0   ketoconazole (NIZORAL) 2 % cream Apply topically.     ketoconazole (NIZORAL) 2 % shampoo Apply topically.     losartan (COZAAR) 100 MG tablet Take 100  mg by mouth daily.     predniSONE (DELTASONE) 10 MG tablet Take prednisone 40mg  daily x 2 days, 30mg  daily x 2 days, 20mg  daily x 2 days and 10mg  daily x 2 days. 20 tablet 0   terbinafine (LAMISIL) 1 % cream Apply topically.     tiZANidine (ZANAFLEX) 4 MG tablet Take 1 tablet (4 mg total) by mouth every 8 (eight) hours as needed for muscle spasms. 30 tablet 0   traMADol (ULTRAM) 50 MG tablet Take by mouth.     No current facility-administered medications for this visit.   Allergies: Allergies  Allergen Reactions   Gabapentin    Lisinopril Other (See Comments)   Social History: Social History   Socioeconomic History   Marital status: Legally Separated    Spouse name: Not on file   Number of children: Not on file   Years of education: Not on file   Highest education level: Not on file  Occupational History   Not on file  Tobacco Use   Smoking status: Every Day    Current packs/day: 0.25    Types: Cigarettes   Smokeless tobacco: Never  Vaping Use   Vaping status: Never Used  Substance and Sexual Activity   Alcohol use: Not Currently   Drug use: Never   Sexual activity: Not on file  Other Topics Concern   Not on file  Social History Narrative   Not on file   Social Determinants of Health   Financial Resource Strain: Medium Risk (  11/22/2021)   Received from [facility]   Overall Financial Resource Strain (CARDIA)    Difficulty of Paying Living Expenses: Somewhat hard  Food Insecurity: Unknown (11/22/2021)   Received from [facility]   Hunger Vital Sign    Worried About Running Out of Food in the Last Year: Patient declined    Ran Out of Food in the Last Year: Patient declined  Transportation Needs: Unknown (11/22/2021)   Received from [facility] - Transportation    Lack of Transportation (Medical): Patient declined    Lack of Transportation (Non-Medical): Patient declined  Physical Activity: Sufficiently Active (11/22/2021)   Received from [facility]    Exercise Vital Sign    Days of Exercise per Week: 6 days    Minutes of Exercise per Session: 150+ min  Stress: Unknown (11/22/2021)   Received from [facility] - Occupational Stress Questionnaire    Feeling of Stress : Patient declined  Social Connections: Unknown (02/11/2022)   Received from [facility]   Social Network    Social Network: Not on file   Lives in a house. Smoking: 1/4 ppd Occupation: IT support specialist.   Environmental History: Water Damage/mildew in the house: no Carpet in the family room: yes Carpet in the bedroom: yes Heating: gas Cooling: central Pet: yes 1 cat x 10 yrs  Family History: Family History  Problem Relation Age of Onset   Healthy Mother    CAD Father    Heart failure Father    Problem                               Relation Asthma                                   no Eczema                                no Food allergy                          no Allergic rhino conjunctivitis     no  Review of Systems  Constitutional:  Negative for appetite change, chills, fever and unexpected weight change.  HENT:  Negative for congestion and rhinorrhea.   Eyes:  Positive for itching.  Respiratory:  Negative for cough, chest tightness, shortness of breath and wheezing.   Cardiovascular:  Negative for chest pain.  Gastrointestinal:  Negative for abdominal pain.  Genitourinary:  Negative for difficulty urinating.  Skin:  Positive for rash.  Allergic/Immunologic: Negative for environmental allergies and food allergies.  Neurological:  Negative for headaches.    Objective: BP 132/82   Pulse 80   Temp 98.1 F (36.7 C)   Resp 18   Ht 5\' 8"  (1.727 m)   Wt 210 lb 12.8 oz (95.6 kg)   SpO2 95%   BMI 32.05 kg/m  Body mass index is 32.05 kg/m. Physical Exam Vitals and nursing note reviewed.  Constitutional:      Appearance: Normal appearance. He is well-developed.  HENT:     Head: Normocephalic  and atraumatic.     Right Ear: Tympanic membrane and external ear normal.     Left Ear: Tympanic membrane and external ear normal.  Nose: Nose normal.     Mouth/Throat:     Mouth: Mucous membranes are moist.     Pharynx: Oropharynx is clear.  Eyes:     Conjunctiva/sclera: Conjunctivae normal.  Cardiovascular:     Rate and Rhythm: Normal rate and regular rhythm.     Heart sounds: Normal heart sounds. No murmur heard.    No friction rub. No gallop.  Pulmonary:     Effort: Pulmonary effort is normal.     Breath sounds: Normal breath sounds. No wheezing, rhonchi or rales.  Musculoskeletal:     Cervical back: Neck supple.  Skin:    General: Skin is warm.     Findings: Rash present.     Comments: Left midaxillary reaction - large erythematous patch with papular bumps.   Neurological:     Mental Status: He is alert and oriented to person, place, and time.  Psychiatric:        Behavior: Behavior normal.   The plan was reviewed with the patient/family, and all questions/concerned were addressed.  It was my pleasure to see Chase Romero today and participate in his care. Please feel free to contact me with any questions or concerns.  Sincerely,  Wyline Mood, DO Allergy & Immunology  [facility] office: [phone] [facility] office: [phone]

## 2023-05-07 ENCOUNTER — Encounter: Payer: Self-pay | Admitting: Allergy

## 2023-05-07 ENCOUNTER — Other Ambulatory Visit: Payer: Self-pay

## 2023-05-07 ENCOUNTER — Ambulatory Visit (INDEPENDENT_AMBULATORY_CARE_PROVIDER_SITE_OTHER): Payer: No Typology Code available for payment source | Admitting: Allergy

## 2023-05-07 VITALS — BP 132/82 | HR 80 | Temp 98.1°F | Resp 18 | Ht 68.0 in | Wt 210.8 lb

## 2023-05-07 DIAGNOSIS — R21 Rash and other nonspecific skin eruption: Secondary | ICD-10-CM

## 2023-05-07 DIAGNOSIS — L299 Pruritus, unspecified: Secondary | ICD-10-CM

## 2023-05-07 DIAGNOSIS — J310 Chronic rhinitis: Secondary | ICD-10-CM | POA: Diagnosis not present

## 2023-05-07 DIAGNOSIS — L309 Dermatitis, unspecified: Secondary | ICD-10-CM | POA: Diagnosis not present

## 2023-05-07 MED ORDER — FAMOTIDINE 20 MG PO TABS: 20.0000 mg | ORAL_TABLET | Freq: Two times a day (BID) | ORAL | 2 refills | Status: DC

## 2023-05-07 MED ORDER — CETIRIZINE HCL 10 MG PO TABS: 10.0000 mg | ORAL_TABLET | Freq: Two times a day (BID) | ORAL | 2 refills | Status: DC

## 2023-05-07 MED ORDER — PREDNISONE 10 MG PO TABS: ORAL_TABLET | ORAL | 0 refills | Status: DC

## 2023-05-07 NOTE — Patient Instructions (Addendum)
Today's skin testing was negative to indoor/outdoor allergens and common foods.  Results given.  Rash: Unclear etiology. Keep track of flares and take pictures.   Take prednisone 40mg  daily x 2 days, 30mg  daily x 2 days, 20mg  daily x 2 days and 10mg  daily x 2 days.  Start zyrtec (cetirizine) 10mg  twice a day. If symptoms are not controlled or causes drowsiness let us know. Start pepcid (famotidine) 20mg  twice a day.  Avoid the following potential triggers: alcohol, tight clothing, NSAIDs, hot showers and getting overheated. See below for proper skin care.  Use fragrance free and dye free products.  Get bloodwork:  We are ordering labs, so please allow 1-2 weeks for the results to come back. With the newly implemented Cures Act, the labs might be visible to you at the same time that they become visible to me. However, I will not address the results until all of the results are back, so please be patient.   Follow up in 1 months or sooner if needed.  Skin care recommendations  Bath time: Always use lukewarm water. AVOID very hot or cold water. Keep bathing time to 5-10 minutes. Do NOT use bubble bath. Use a mild soap and use just enough to wash the dirty areas. Do NOT scrub skin vigorously.  After bathing, pat dry your skin with a towel. Do NOT rub or scrub the skin.  Moisturizers and prescriptions:  ALWAYS apply moisturizers immediately after bathing (within 3 minutes). This helps to lock-in moisture. Use the moisturizer several times a day over the whole body. Good summer moisturizers include: Aveeno, CeraVe, Cetaphil. Good winter moisturizers include: Aquaphor, Vaseline, Cerave, Cetaphil, Eucerin, Vanicream. When using moisturizers along with medications, the moisturizer should be applied about one hour after applying the medication to prevent diluting effect of the medication or moisturize around where you applied the medications. When not using medications, the moisturizer  can be continued twice daily as maintenance.  Laundry and clothing: Avoid laundry products with added color or perfumes. Use unscented hypo-allergenic laundry products such as Tide free, Cheer free & gentle, and All free and clear.  If the skin still seems dry or sensitive, you can try double-rinsing the clothes. Avoid tight or scratchy clothing such as wool. Do not use fabric softeners or dyer sheets.

## 2023-05-08 ENCOUNTER — Encounter: Payer: Self-pay | Admitting: Allergy

## 2023-05-08 DIAGNOSIS — J310 Chronic rhinitis: Secondary | ICD-10-CM | POA: Insufficient documentation

## 2023-05-08 DIAGNOSIS — R21 Rash and other nonspecific skin eruption: Secondary | ICD-10-CM | POA: Insufficient documentation

## 2023-05-08 DIAGNOSIS — L309 Dermatitis, unspecified: Secondary | ICD-10-CM | POA: Insufficient documentation

## 2023-05-08 NOTE — Assessment & Plan Note (Signed)
Daily pruritic rash x 8 month son the torso, scalp and thighs. Tried topical creams with minimal benefit. Denies any changes in diet, personal care products or recent infections. Concerned about shingles vaccine as it started 2 weeks after receiving but also go his second shingles vaccine with no flares. No issues with other vaccines. Concerned about allergic triggers. Today's skin prick testing was negative to indoor/outdoor allergens and common foods. Unclear etiology. Keep track of flares and take pictures.  Take prednisone 40mg  daily x 2 days, 30mg  daily x 2 days, 20mg  daily x 2 days and 10mg  daily x 2 days. Start zyrtec (cetirizine) 10mg  twice a day. If symptoms are not controlled or causes drowsiness let us know. Start pepcid (famotidine) 20mg  twice a day.  Avoid the following potential triggers: alcohol, tight clothing, NSAIDs, hot showers and getting overheated. See below for proper skin care.  Use fragrance free and dye free products. Get bloodwork. If negative will recommend patch testing and dermatology referral next.

## 2023-05-08 NOTE — Assessment & Plan Note (Signed)
Mild rhinitis symptoms in the spring. Today's skin prick testing was negative to indoor/outdoor allergens. Monitor symptoms.

## 2023-05-30 ENCOUNTER — Encounter: Payer: Self-pay | Admitting: Allergy

## 2023-05-30 NOTE — Progress Notes (Signed)
Patient dropped off test results. See scanned reports.        Allergens w/Total IgE Area 2 - IgE 205, negative panel.      Alpha-Gal Panel - alpha gal IgE negative       ANA w/Reflex - negative      C3 and C4 - C3 121, C4 28.1      CBC with Differential/Platelet -unremarkable      Chronic Urticaria - pending       Comprehensive metabolic panel - unremarkable      C-reactive protein - 0.84      Protein Electrophoresis, Urine Rflx. - normal.          Protein electrophoresis, serum - normal      Sedimentation rate - 3      Tryptase - normal 4.8      Thyroid Cascade Profile - TSH 0.516 ( range is 0.55-4.78) and free T3 2.9 (range is 3.00-5.2), free T4 0.93 (range 0.89-1.76)

## 2023-06-05 NOTE — Progress Notes (Unsigned)
Follow Up Note  RE: Chase Romero MRN: [id number] DOB: 26-Apr-1961 Date of Office Visit: 06/06/2023  Referring provider: Pearson Grippe, MD Primary care provider: [facility], [place]  Chief Complaint: No chief complaint on file.  History of Present Illness: I had the pleasure of seeing Chase Romero for a follow up visit at the [facility] on 06/05/2023. He is a 62 y.o. male, who is being followed for rash and chronic rhinitis. His previous allergy office visit was on 05/07/2023 with Dr. Selena Batten. Today is a regular follow up visit.       Allergens w/Total IgE Area 2 - IgE 205, negative panel.      Alpha-Gal Panel - alpha gal IgE negative        ANA w/Reflex - negative      C3 and C4 - C3 121, C4 28.1      CBC with Differential/Platelet -unremarkable      Chronic Urticaria - pending       Comprehensive metabolic panel - unremarkable      C-reactive protein - 0.84      Protein Electrophoresis, Urine Rflx. - normal.          Protein electrophoresis, serum - normal      Sedimentation rate - 3      Tryptase - normal 4.8      Thyroid Cascade Profile - TSH 0.516 ( range is 0.55-4.78) and free T3 2.9 (range is 3.00-5.2), free T4 0.93 (range 0.89-1.76)  Rash and other nonspecific skin eruption Daily pruritic rash x 8 month son the torso, scalp and thighs. Tried topical creams with minimal benefit. Denies any changes in diet, personal care products or recent infections. Concerned about shingles vaccine as it started 2 weeks after receiving but also go his second shingles vaccine with no flares. No issues with other vaccines. Concerned about allergic triggers. Today's skin prick testing was negative to indoor/outdoor allergens and common foods. Unclear etiology. Keep track of flares and take pictures.  Take prednisone 40mg  daily x 2 days, 30mg  daily x 2 days, 20mg  daily x 2 days and 10mg  daily x 2 days. Start zyrtec (cetirizine) 10mg  twice a day. If symptoms are not controlled or  causes drowsiness let us know. Start pepcid (famotidine) 20mg  twice a day.  Avoid the following potential triggers: alcohol, tight clothing, NSAIDs, hot showers and getting overheated. See below for proper skin care.  Use fragrance free and dye free products. Get bloodwork. If negative will recommend patch testing and dermatology referral next.    Chronic rhinitis Mild rhinitis symptoms in the spring. Today's skin prick testing was negative to indoor/outdoor allergens. Monitor symptoms.  Assessment and Plan: Chase Romero is a 62 y.o. male with: ***  No follow-ups on file.  No orders of the defined types were placed in this encounter.  Lab Orders  No laboratory test(s) ordered today    Diagnostics: Spirometry:  Tracings reviewed. His effort: {Blank single:19197::"Good reproducible efforts.","It was hard to get consistent efforts and there is a question as to whether this reflects a maximal maneuver.","Poor effort, data can not be interpreted."} FVC: ***L FEV1: ***L, ***% predicted FEV1/FVC ratio: ***% Interpretation: {Blank single:19197::"Spirometry consistent with mild obstructive disease","Spirometry consistent with moderate obstructive disease","Spirometry consistent with severe obstructive disease","Spirometry consistent with possible restrictive disease","Spirometry consistent with mixed obstructive and restrictive disease","Spirometry uninterpretable due to technique","Spirometry consistent with normal pattern","No overt abnormalities noted given today's efforts"}.  Please see scanned spirometry results for details.  Skin Testing: {Blank single:19197::"Select  foods","Environmental allergy panel","Environmental allergy panel and select foods","Food allergy panel","None","Deferred due to recent antihistamines use"}. *** Results discussed with patient/family.   Medication List:  Current Outpatient Medications  Medication Sig Dispense Refill   betamethasone dipropionate 0.05 %  cream Apply topically.     cetirizine (ZYRTEC ALLERGY) 10 MG tablet Take 1 tablet (10 mg total) by mouth 2 (two) times daily. 60 tablet 2   cyclobenzaprine (FLEXERIL) 10 MG tablet Take by mouth.     famotidine (PEPCID) 20 MG tablet Take 1 tablet (20 mg total) by mouth 2 (two) times daily. 60 tablet 2   hydrochlorothiazide (HYDRODIURIL) 25 MG tablet Take 25 mg by mouth daily.     ibuprofen (ADVIL) 600 MG tablet Take 1 tablet (600 mg total) by mouth every 6 (six) hours as needed. 30 tablet 0   ketoconazole (NIZORAL) 2 % cream Apply topically.     ketoconazole (NIZORAL) 2 % shampoo Apply topically.     losartan (COZAAR) 100 MG tablet Take 100 mg by mouth daily.     predniSONE (DELTASONE) 10 MG tablet Take prednisone 40mg  daily x 2 days, 30mg  daily x 2 days, 20mg  daily x 2 days and 10mg  daily x 2 days. 20 tablet 0   terbinafine (LAMISIL) 1 % cream Apply topically.     tiZANidine (ZANAFLEX) 4 MG tablet Take 1 tablet (4 mg total) by mouth every 8 (eight) hours as needed for muscle spasms. 30 tablet 0   traMADol (ULTRAM) 50 MG tablet Take by mouth.     No current facility-administered medications for this visit.   Allergies: Allergies  Allergen Reactions   Gabapentin    Lisinopril Other (See Comments)   I reviewed his past medical history, social history, family history, and environmental history and no significant changes have been reported from his previous visit.  Review of Systems  Constitutional:  Negative for appetite change, chills, fever and unexpected weight change.  HENT:  Negative for congestion and rhinorrhea.   Eyes:  Positive for itching.  Respiratory:  Negative for cough, chest tightness, shortness of breath and wheezing.   Cardiovascular:  Negative for chest pain.  Gastrointestinal:  Negative for abdominal pain.  Genitourinary:  Negative for difficulty urinating.  Skin:  Positive for rash.  Allergic/Immunologic: Negative for environmental allergies and food allergies.   Neurological:  Negative for headaches.    Objective: There were no vitals taken for this visit. There is no height or weight on file to calculate BMI. Physical Exam Vitals and nursing note reviewed.  Constitutional:      Appearance: Normal appearance. He is well-developed.  HENT:     Head: Normocephalic and atraumatic.     Right Ear: Tympanic membrane and external ear normal.     Left Ear: Tympanic membrane and external ear normal.     Nose: Nose normal.     Mouth/Throat:     Mouth: Mucous membranes are moist.     Pharynx: Oropharynx is clear.  Eyes:     Conjunctiva/sclera: Conjunctivae normal.  Cardiovascular:     Rate and Rhythm: Normal rate and regular rhythm.     Heart sounds: Normal heart sounds. No murmur heard.    No friction rub. No gallop.  Pulmonary:     Effort: Pulmonary effort is normal.     Breath sounds: Normal breath sounds. No wheezing, rhonchi or rales.  Musculoskeletal:     Cervical back: Neck supple.  Skin:    General: Skin is warm.     Findings:  Rash present.     Comments: Left midaxillary reaction - large erythematous patch with papular bumps.   Neurological:     Mental Status: He is alert and oriented to person, place, and time.  Psychiatric:        Behavior: Behavior normal.    Previous notes and tests were reviewed. The plan was reviewed with the patient/family, and all questions/concerned were addressed.  It was my pleasure to see Chase Romero today and participate in his care. Please feel free to contact me with any questions or concerns.  Sincerely,  Wyline Mood, DO Allergy & Immunology  [facility] office: [phone] [facility] office: [phone]

## 2023-06-06 ENCOUNTER — Encounter: Payer: Self-pay | Admitting: Allergy

## 2023-06-06 ENCOUNTER — Ambulatory Visit: Payer: No Typology Code available for payment source | Admitting: Allergy

## 2023-06-06 VITALS — BP 132/78 | HR 72 | Temp 98.7°F | Resp 18

## 2023-06-06 DIAGNOSIS — R21 Rash and other nonspecific skin eruption: Secondary | ICD-10-CM

## 2023-06-06 DIAGNOSIS — L299 Pruritus, unspecified: Secondary | ICD-10-CM

## 2023-06-06 DIAGNOSIS — J310 Chronic rhinitis: Secondary | ICD-10-CM

## 2023-06-06 MED ORDER — FLUOCINOLONE ACETONIDE SCALP 0.01 % EX OIL: TOPICAL_OIL | CUTANEOUS | 0 refills | Status: DC

## 2023-06-06 MED ORDER — FEXOFENADINE HCL 180 MG PO TABS: 180.0000 mg | ORAL_TABLET | Freq: Two times a day (BID) | ORAL | 3 refills | Status: DC

## 2023-06-06 MED ORDER — TRIAMCINOLONE ACETONIDE 0.1 % EX OINT: 1.0000 | TOPICAL_OINTMENT | Freq: Two times a day (BID) | CUTANEOUS | 1 refills | Status: AC | PRN

## 2023-06-06 NOTE — Patient Instructions (Addendum)
Rash: Unclear etiology. Keep track of flares and take pictures.  Start allegra (fexofenadine) 180mg  twice a day. Stop zyrtec.  If symptoms are not controlled or causes drowsiness let us know. Continue Pepcid (famotidine) 20mg  twice a day.  Avoid the following potential triggers: alcohol, tight clothing, NSAIDs, hot showers and getting overheated. See below for proper skin care. Use fragrance free and dye free products. No dryer sheets or fabric softener.    Use triamcinolone 0.1% ointment twice a day as needed for rash flares. Do not use on the face, neck, armpits or groin area. Do not use more than 3 weeks in a row.  For the scalp Use the oil on the areas where it's itchy/rashy - leave on overnight or >4h before washing Use it only as needed.   Follow up with your PCP regarding dermatology referral. Chronic urticaria bloodwork was pending - see if you can get me those results.  Hold off patch testing.   Follow up in 2-3 months or sooner if needed.  Skin care recommendations  Bath time: Always use lukewarm water. AVOID very hot or cold water. Keep bathing time to 5-10 minutes. Do NOT use bubble bath. Use a mild soap and use just enough to wash the dirty areas. Do NOT scrub skin vigorously.  After bathing, pat dry your skin with a towel. Do NOT rub or scrub the skin.  Moisturizers and prescriptions:  ALWAYS apply moisturizers immediately after bathing (within 3 minutes). This helps to lock-in moisture. Use the moisturizer several times a day over the whole body. Good summer moisturizers include: Aveeno, CeraVe, Cetaphil. Good winter moisturizers include: Aquaphor, Vaseline, Cerave, Cetaphil, Eucerin, Vanicream. When using moisturizers along with medications, the moisturizer should be applied about one hour after applying the medication to prevent diluting effect of the medication or moisturize around where you applied the medications. When not using medications, the moisturizer  can be continued twice daily as maintenance.  Laundry and clothing: Avoid laundry products with added color or perfumes. Use unscented hypo-allergenic laundry products such as Tide free, Cheer free & gentle, and All free and clear.  If the skin still seems dry or sensitive, you can try double-rinsing the clothes. Avoid tight or scratchy clothing such as wool. Do not use fabric softeners or dyer sheets.

## 2023-08-01 NOTE — Progress Notes (Signed)
 [facility]  08/01/2023  Chase Romero 04/05/1961    Impression   1. Nephrolithiasis (Primary) -     Urinalysis; Future    Plan   Stone prevention measures stressed to the patient.  Patient will increase fluid intake, add lemon juice or other sources of citrate to his diet, He will reduce animal protein in excess, and reduce sodium intake.  We have also encouraged the patient to consume less oxalate beverages such as brewed beverages or colas.  RTC 6 months, for KUB  Risks, benefits, and alternatives of the medications and treatment plan prescribed today were discussed, and patient expressed understanding. Plan follow-up as discussed or as needed if any worsening symptoms or change in condition.    No follow-ups on file.    Subjective   Chase Romero is a 62 y.o. (DOB [birth year]) male.     Patient presents with  . Follow-up     Patient comes in for evaluation of left nephrolithiasis.  Status post retrograde endoscopic treatment of large left proximal ureteral stone followed by subsequent lithotripsy.  Recent CT scan showed complete resolution of the ureteral stone with stable appearing left lower pole stone and a nonobstructing left calyceal stone.  Stones were 8 and 9 mm respectively.  Denies current flank pain.  Denies gross hematuria.  Does report some weakness in his urinary stream.  Currently not taking his tamsulosin.   Reviewed and updated this visit by provider: Tobacco  Allergies  Meds  Problems  Med Hx  Surg Hx  Fam Hx          Review of Systems  Constitutional:  Negative for chills and fever.  Genitourinary:  Negative for difficulty urinating, dysuria, flank pain, frequency, hematuria and urgency.    Objective   Vitals:   08/01/23 1436  BP: 125/77  Patient Position: Sitting  Pulse: 81  Temp: 97 F (36.1 C)  TempSrc: Oral  Height: 5' 10 (1.778 m)  Weight: 230 lb (104.3 kg)  BMI (Calculated): 33  PainSc:   3   Physical Exam Vitals  reviewed.  Constitutional:      General: He is not in acute distress.    Appearance: Normal appearance. He is not ill-appearing.  HENT:     Head: Normocephalic and atraumatic.  Eyes:     Extraocular Movements: Extraocular movements intact.  Musculoskeletal:        General: No deformity. Normal range of motion.     Cervical back: Normal range of motion and neck supple.  Pulmonary:     Effort: Pulmonary effort is normal.  Abdominal:     Tenderness: There is no abdominal tenderness.  Neurological:     General: No focal deficit present.     Mental Status: He is alert and oriented to person, place, and time. Mental status is at baseline.  Psychiatric:        Mood and Affect: Mood normal.        Behavior: Behavior normal.        Thought Content: Thought content normal.     Labs:  No results found for: UA, PSA, TESTOSTERONE  No results found for this or any previous visit (from the past 24 hour(s)).  Imaging: No results found.   I reviewed pertinent sources of patient information including CT report from [place]

## 2023-09-04 NOTE — Progress Notes (Deleted)
Follow Up Note  RE: Chase Romero MRN: [id number] DOB: [birth year] Date of Office Visit: 09/05/2023  Referring provider: Jonathon Romero Primary care provider: [facility], [place]  Chief Complaint: No chief complaint on file.  History of Present Illness: I had the pleasure of seeing Chase Romero for a follow up visit at the [facility] on 09/04/2023. He is a 62 y.o. male, who is being followed for pruritic rash, chronic rhinitis. His previous allergy office visit was on 06/06/2023 with Dr. Selena Romero. Today is a regular follow up visit.  Discussed the use of AI scribe software for clinical note transcription with the patient, who gave verbal consent to proceed.  History of Present Illness            Rash and other nonspecific skin eruption Pruritus Past history - Daily pruritic rash x 8 month son the torso, scalp and thighs. Tried topical creams with minimal benefit. Denies any changes in diet, personal care products or recent infections. Concerned about shingles vaccine as it started 2 weeks after receiving but also go his second shingles vaccine with no flares. No issues with other vaccines. 2024 skin prick testing was negative to indoor/outdoor allergens and common foods. Interim history - resolved with prednisone but slowly returning despite using antihistamines. 2024 bloodwork (alpha gal, ANA, C3, C4, CBC diff, CMP, crp, esr, tryptase, UPEP, SPEP unremarkable); TSH slightly low. CU index pending.  Unclear etiology. Keep track of flares and take pictures.  Start allegra (fexofenadine) 180mg  twice a day. Stop zyrtec.  If symptoms are not controlled or causes drowsiness let us know. Continue Pepcid (famotidine) 20mg  twice a day.  Avoid the following potential triggers: alcohol, tight clothing, NSAIDs, hot showers and getting overheated. See below for proper skin care. Use fragrance free and dye free products. No dryer sheets or fabric softener.   Use triamcinolone 0.1%  ointment twice a day as needed for rash flares. Do not use on the face, neck, armpits or groin area. Do not use more than 3 weeks in a row.  For the scalp Use flucinonide scalp oil on the areas where it's itchy/rashy - leave on overnight or >4h before washing Use it only as needed.  Follow up with your PCP regarding dermatology referral. Chronic urticaria bloodwork was pending - see if you can get me those results.  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    Chronic rhinitis Past history - 2024 skin prick testing negative to indoor/outdoor allergens. Interim history - 2024 bloodwork negative to environmental panel. Monitor symptoms.   Assessment and Plan: Chase Romero is a 62 y.o. male with: *** Assessment and Plan              No follow-ups on file.  No orders of the defined types were placed in this encounter.  Lab Orders  No laboratory test(s) ordered today    Diagnostics: Spirometry:  Tracings reviewed. His effort: {Blank single:19197::"Good reproducible efforts.","It was hard to get consistent efforts and there is a question as to whether this reflects a maximal maneuver.","Poor effort, data can not be interpreted."} FVC: ***L FEV1: ***L, ***% predicted FEV1/FVC ratio: ***% Interpretation: {Blank single:19197::"Spirometry consistent with mild obstructive disease","Spirometry consistent with moderate obstructive disease","Spirometry consistent with severe obstructive disease","Spirometry consistent with possible restrictive disease","Spirometry consistent with mixed obstructive and restrictive disease","Spirometry uninterpretable due to technique","Spirometry consistent with normal pattern","No overt abnormalities noted given today's efforts"}.  Please see scanned spirometry results for details.  Skin Testing: {Blank  single:19197::"Select foods","Environmental allergy panel","Environmental allergy panel and select foods","Food allergy  panel","None","Deferred due to recent antihistamines use"}. *** Results discussed with patient/family.   Medication List:  Current Outpatient Medications  Medication Sig Dispense Refill   betamethasone dipropionate 0.05 % cream Apply topically.     cyclobenzaprine (FLEXERIL) 10 MG tablet Take by mouth.     famotidine (PEPCID) 20 MG tablet Take 1 tablet (20 mg total) by mouth 2 (two) times daily. 60 tablet 2   fexofenadine (ALLEGRA) 180 MG tablet Take 1 tablet (180 mg total) by mouth in the morning and at bedtime. 60 tablet 3   Fluocinolone Acetonide Scalp 0.01 % OIL Apply to scalp area where you are itchy and leave overnight or over 4 hours and then wash it off. Use it only as needed. 118 mL 0   hydrochlorothiazide (HYDRODIURIL) 25 MG tablet Take 25 mg by mouth daily.     ibuprofen (ADVIL) 600 MG tablet Take 1 tablet (600 mg total) by mouth every 6 (six) hours as needed. 30 tablet 0   losartan (COZAAR) 100 MG tablet Take 100 mg by mouth daily.     QUEtiapine (SEROQUEL) 100 MG tablet Take by mouth.     terbinafine (LAMISIL) 1 % cream Apply topically.     tiZANidine (ZANAFLEX) 4 MG tablet Take 1 tablet (4 mg total) by mouth every 8 (eight) hours as needed for muscle spasms. 30 tablet 0   traMADol (ULTRAM) 50 MG tablet Take by mouth.     triamcinolone ointment (KENALOG) 0.1 % Apply 1 Application topically 2 (two) times daily as needed (rash flare). Do not use on the face, neck, armpits or groin area. Do not use more than 3 weeks in a row. 30 g 1   No current facility-administered medications for this visit.   Allergies: Allergies  Allergen Reactions   Gabapentin    Lisinopril Other (See Comments)   I reviewed his past medical history, social history, family history, and environmental history and no significant changes have been reported from his previous visit.  Review of Systems  Constitutional:  Negative for appetite change, chills, fever and unexpected weight change.  HENT:   Negative for congestion and rhinorrhea.   Eyes:  Positive for itching.  Respiratory:  Negative for cough, chest tightness, shortness of breath and wheezing.   Cardiovascular:  Negative for chest pain.  Gastrointestinal:  Negative for abdominal pain.  Genitourinary:  Negative for difficulty urinating.  Skin:  Positive for rash.  Allergic/Immunologic: Negative for environmental allergies and food allergies.  Neurological:  Negative for headaches.    Objective: There were no vitals taken for this visit. There is no height or weight on file to calculate BMI. Physical Exam Vitals and nursing note reviewed.  Constitutional:      Appearance: Normal appearance. He is well-developed.  HENT:     Head: Normocephalic and atraumatic.     Right Ear: Tympanic membrane and external ear normal.     Left Ear: Tympanic membrane and external ear normal.     Nose: Nose normal.     Mouth/Throat:     Mouth: Mucous membranes are moist.     Pharynx: Oropharynx is clear.  Eyes:     Conjunctiva/sclera: Conjunctivae normal.  Cardiovascular:     Rate and Rhythm: Normal rate and regular rhythm.     Heart sounds: Normal heart sounds. No murmur heard.    No friction rub. No gallop.  Pulmonary:     Effort: Pulmonary effort is normal.  Breath sounds: Normal breath sounds. No wheezing, rhonchi or rales.  Musculoskeletal:     Cervical back: Neck supple.  Skin:    General: Skin is warm.     Findings: Rash present.     Comments: Fine erythematous rash on scalp area where his hair is.   Neurological:     Mental Status: He is alert and oriented to person, place, and time.  Psychiatric:        Behavior: Behavior normal.    Previous notes and tests were reviewed. The plan was reviewed with the patient/family, and all questions/concerned were addressed.  It was my pleasure to see Mitsuo today and participate in his care. Please feel free to contact me with any questions or concerns.  Sincerely,  Wyline Mood, DO Allergy & Immunology  [facility] office: [phone] [facility] office: [phone]

## 2023-09-05 ENCOUNTER — Ambulatory Visit: Payer: No Typology Code available for payment source | Admitting: Allergy

## 2024-05-16 NOTE — ED Notes (Signed)
 PO challenge completed and no problems noted at this time.

## 2024-09-08 ENCOUNTER — Other Ambulatory Visit: Payer: Self-pay

## 2024-09-08 ENCOUNTER — Emergency Department (HOSPITAL_BASED_OUTPATIENT_CLINIC_OR_DEPARTMENT_OTHER)

## 2024-09-08 ENCOUNTER — Encounter (HOSPITAL_BASED_OUTPATIENT_CLINIC_OR_DEPARTMENT_OTHER): Payer: Self-pay | Admitting: Emergency Medicine

## 2024-09-08 ENCOUNTER — Inpatient Hospital Stay (HOSPITAL_BASED_OUTPATIENT_CLINIC_OR_DEPARTMENT_OTHER)
Admission: EM | Admit: 2024-09-08 | Discharge: 2024-09-09 | DRG: 281 | Disposition: A | Discharge status: Home / Self Care | Attending: Cardiology | Admitting: Cardiology

## 2024-09-08 DIAGNOSIS — I214 Non-ST elevation (NSTEMI) myocardial infarction: Secondary | ICD-10-CM | POA: Diagnosis present

## 2024-09-08 DIAGNOSIS — Z7982 Long term (current) use of aspirin: Secondary | ICD-10-CM | POA: Diagnosis not present

## 2024-09-08 DIAGNOSIS — Z7902 Long term (current) use of antithrombotics/antiplatelets: Secondary | ICD-10-CM | POA: Diagnosis not present

## 2024-09-08 DIAGNOSIS — Z888 Allergy status to other drugs, medicaments and biological substances status: Secondary | ICD-10-CM | POA: Diagnosis not present

## 2024-09-08 DIAGNOSIS — I2510 Atherosclerotic heart disease of native coronary artery without angina pectoris: Secondary | ICD-10-CM | POA: Diagnosis present

## 2024-09-08 DIAGNOSIS — Y93H9 Activity, other involving exterior property and land maintenance, building and construction: Secondary | ICD-10-CM

## 2024-09-08 DIAGNOSIS — E785 Hyperlipidemia, unspecified: Secondary | ICD-10-CM

## 2024-09-08 DIAGNOSIS — X58XXXA Exposure to other specified factors, initial encounter: Secondary | ICD-10-CM | POA: Diagnosis present

## 2024-09-08 DIAGNOSIS — Z79899 Other long term (current) drug therapy: Secondary | ICD-10-CM

## 2024-09-08 DIAGNOSIS — Z8249 Family history of ischemic heart disease and other diseases of the circulatory system: Secondary | ICD-10-CM

## 2024-09-08 DIAGNOSIS — D72829 Elevated white blood cell count, unspecified: Secondary | ICD-10-CM | POA: Diagnosis present

## 2024-09-08 DIAGNOSIS — N179 Acute kidney failure, unspecified: Secondary | ICD-10-CM | POA: Diagnosis present

## 2024-09-08 DIAGNOSIS — I249 Acute ischemic heart disease, unspecified: Principal | ICD-10-CM

## 2024-09-08 DIAGNOSIS — S6992XA Unspecified injury of left wrist, hand and finger(s), initial encounter: Secondary | ICD-10-CM | POA: Diagnosis present

## 2024-09-08 DIAGNOSIS — S6990XA Unspecified injury of unspecified wrist, hand and finger(s), initial encounter: Secondary | ICD-10-CM | POA: Insufficient documentation

## 2024-09-08 DIAGNOSIS — Z720 Tobacco use: Secondary | ICD-10-CM | POA: Insufficient documentation

## 2024-09-08 DIAGNOSIS — F17210 Nicotine dependence, cigarettes, uncomplicated: Secondary | ICD-10-CM | POA: Diagnosis present

## 2024-09-08 DIAGNOSIS — I10 Essential (primary) hypertension: Secondary | ICD-10-CM

## 2024-09-08 LAB — CBC WITH DIFFERENTIAL/PLATELET
Abs Immature Granulocytes: 0.08 K/uL — ABNORMAL HIGH (ref 0.00–0.07)
Basophils Absolute: 0.1 K/uL (ref 0.0–0.1)
Basophils Relative: 1 %
Eosinophils Absolute: 0.3 K/uL (ref 0.0–0.5)
Eosinophils Relative: 2 %
HCT: 48.7 % (ref 39.0–52.0)
Hemoglobin: 16.7 g/dL (ref 13.0–17.0)
Immature Granulocytes: 1 %
Lymphocytes Relative: 14 %
Lymphs Abs: 1.7 K/uL (ref 0.7–4.0)
MCH: 28.7 pg (ref 26.0–34.0)
MCHC: 34.3 g/dL (ref 30.0–36.0)
MCV: 83.7 fL (ref 80.0–100.0)
Monocytes Absolute: 0.8 K/uL (ref 0.1–1.0)
Monocytes Relative: 7 %
Neutro Abs: 9.5 K/uL — ABNORMAL HIGH (ref 1.7–7.7)
Neutrophils Relative %: 75 %
Platelets: 286 K/uL (ref 150–400)
RBC: 5.82 MIL/uL — ABNORMAL HIGH (ref 4.22–5.81)
RDW: 13.2 % (ref 11.5–15.5)
WBC: 12.5 K/uL — ABNORMAL HIGH (ref 4.0–10.5)
nRBC: 0 % (ref 0.0–0.2)

## 2024-09-08 LAB — COMPREHENSIVE METABOLIC PANEL WITH GFR
ALT: 29 U/L (ref 0–44)
AST: 32 U/L (ref 15–41)
Albumin: 5.1 g/dL — ABNORMAL HIGH (ref 3.5–5.0)
Alkaline Phosphatase: 86 U/L (ref 38–126)
Anion gap: 18 — ABNORMAL HIGH (ref 5–15)
BUN: 25 mg/dL — ABNORMAL HIGH (ref 8–23)
CO2: 20 mmol/L — ABNORMAL LOW (ref 22–32)
Calcium: 10 mg/dL (ref 8.9–10.3)
Chloride: 105 mmol/L (ref 98–111)
Creatinine, Ser: 1.67 mg/dL — ABNORMAL HIGH (ref 0.61–1.24)
GFR, Estimated: 46 mL/min — ABNORMAL LOW (ref >60)
Glucose, Bld: 88 mg/dL (ref 70–99)
Potassium: 3.7 mmol/L (ref 3.5–5.1)
Sodium: 142 mmol/L (ref 135–145)
Total Bilirubin: 0.5 mg/dL (ref 0.0–1.2)
Total Protein: 8 g/dL (ref 6.5–8.1)

## 2024-09-08 LAB — HEMOGLOBIN A1C
Hgb A1c MFr Bld: 5.2 % (ref 4.8–5.6)
Mean Plasma Glucose: 102.54 mg/dL

## 2024-09-08 LAB — TROPONIN T, HIGH SENSITIVITY
Troponin T High Sensitivity: 85 ng/L — ABNORMAL HIGH (ref 0–19)
Troponin T High Sensitivity: 78 ng/L — ABNORMAL HIGH (ref 0–19)

## 2024-09-08 LAB — TSH: TSH: 1.836 u[IU]/mL (ref 0.350–4.500)

## 2024-09-08 MED ORDER — ACETAMINOPHEN 325 MG PO TABS
650.0000 mg | ORAL_TABLET | ORAL | Status: DC | PRN
Start: 2024-09-08 — End: 2024-09-09
  Administered 2024-09-09: 650 mg via ORAL
  Filled 2024-09-08: qty 2

## 2024-09-08 MED ORDER — ROSUVASTATIN CALCIUM 20 MG PO TABS
20.0000 mg | ORAL_TABLET | Freq: Every day | ORAL | Status: DC
  Administered 2024-09-08 – 2024-09-09 (×2): 20 mg via ORAL
  Filled 2024-09-08 (×2): qty 1

## 2024-09-08 MED ORDER — HEPARIN (PORCINE) 25000 UT/250ML-% IV SOLN
1400.0000 [IU]/h | INTRAVENOUS | Status: DC
  Administered 2024-09-08: 1200 [IU]/h via INTRAVENOUS
  Filled 2024-09-08: qty 250

## 2024-09-08 MED ORDER — SODIUM CHLORIDE 0.9 % WEIGHT BASED INFUSION
1.0000 mL/kg/h | INTRAVENOUS | Status: DC
  Administered 2024-09-08 – 2024-09-09 (×2): 1 mL/kg/h via INTRAVENOUS

## 2024-09-08 MED ORDER — NITROGLYCERIN 0.4 MG SL SUBL: 0.4000 mg | SUBLINGUAL_TABLET | SUBLINGUAL | Status: DC | PRN

## 2024-09-08 MED ORDER — ACETAMINOPHEN 325 MG PO TABS
650.0000 mg | ORAL_TABLET | Freq: Once | ORAL | Status: AC
  Administered 2024-09-08: 650 mg via ORAL
  Filled 2024-09-08: qty 2

## 2024-09-08 MED ORDER — HEPARIN BOLUS VIA INFUSION
4000.0000 [IU] | Freq: Once | INTRAVENOUS | Status: AC
  Administered 2024-09-08: 4000 [IU] via INTRAVENOUS
  Filled 2024-09-08: qty 4000

## 2024-09-08 MED ORDER — ASPIRIN 81 MG PO TBEC
81.0000 mg | DELAYED_RELEASE_TABLET | Freq: Every day | ORAL | Status: DC
  Administered 2024-09-09: 81 mg via ORAL
  Filled 2024-09-08: qty 1

## 2024-09-08 MED ORDER — ONDANSETRON HCL 4 MG/2ML IJ SOLN: 4.0000 mg | Freq: Four times a day (QID) | INTRAMUSCULAR | Status: DC | PRN

## 2024-09-08 MED ORDER — ASPIRIN 81 MG PO CHEW
324.0000 mg | CHEWABLE_TABLET | Freq: Once | ORAL | Status: AC
  Administered 2024-09-08: 324 mg via ORAL
  Filled 2024-09-08: qty 4

## 2024-09-08 NOTE — ED Provider Notes (Addendum)
 Patient assessed at bedside with girlfriend present after transfer from [facility].  Reports he blasted the tip of his fourth finger on his left hand with a pressure washer earlier today.  Continues to have pain and swelling of the digit, stable from prior per his report.  Sensation is intact the distal fourth fingertip with full ROM.  Dr. Romona from hand surgery aware of patient case.  States he does not have any chest pain or shortness of breath currently.  States that episode of chest pain lasted after he blasted his finger until he arrived at [facility] ED but is now resolved.  Denies any dyspnea or chest pain on exertion in prior months or any significant cardiac history.  Initial troponin 85, trended down to 78.  EKG with nonspecific ST changes, pending cardiology evaluation for further management.  Addendum 19:57: Cardiology planning to admit for further workup and evaluation.  Hand surgeon Dr. Romona made aware and planning to see patient in consult.  Addendum 20:29: Hand surgery Dr. Romona relates no further follow up needed.   Theophilus Pagan, MD 09/08/24 ART[facility]    Theophilus Pagan, MD 09/08/24 2030    Doretha Folks, MD 09/08/24 2330

## 2024-09-08 NOTE — Progress Notes (Signed)
 ANTICOAGULATION CONSULT NOTE  Pharmacy Consult for Heparin  Indication: chest pain/ACS  Allergies  Allergen Reactions   Gabapentin    Lisinopril Other (See Comments)    Patient Measurements: Height: 5' 10 (177.8 cm) Weight: 99.8 kg (220 lb) IBW/kg (Calculated) : 73 Heparin  Dosing Weight: 93.8 kg  Vital Signs: Temp: 98 F (36.7 C) (11/25 1939) Temp Source: Oral (11/25 1939) BP: 121/91 (11/25 1930) Pulse Rate: 75 (11/25 1930)  Labs: Recent Labs    09/08/24 1522  HGB 16.7  HCT 48.7  PLT 286  CREATININE 1.67*    Estimated Creatinine Clearance: 54.3 mL/min (A) (by C-G formula based on SCr of 1.67 mg/dL (H)).   Medical History: Past Medical History:  Diagnosis Date   Hypertension    Urticaria     Medications:  (Not in a hospital admission)  Scheduled:   [START ON 09/09/2024] aspirin  EC  81 mg Oral Daily   rosuvastatin   20 mg Oral Daily   Infusions:   sodium chloride      PRN: acetaminophen , nitroGLYCERIN , ondansetron  (ZOFRAN ) IV  Assessment: Chase Romero with a history of HTN, HLD. Patient is presenting with chest pain. Heparin  per pharmacy consult placed for chest pain/ACS.  Patient is not on anticoagulation prior to arrival.  Hgb 16.7; plt 286  Goal of Therapy:  Heparin  level 0.3-0.7 units/ml Monitor platelets by anticoagulation protocol: Yes   Plan:  Give IV heparin  4000 units bolus x 1 Start heparin  infusion at 1200 units/hr Check anti-Xa level in 8 hours and daily while on heparin  Continue to monitor H&H and platelets  Dorn Buttner, PharmD, BCPS 09/08/2024 7:56 PM ED Clinical Pharmacist -  [phone]

## 2024-09-08 NOTE — ED Notes (Signed)
 2nd trop drawn prior to transport

## 2024-09-08 NOTE — Consult Note (Signed)
 HAND SURGERY CONSULTATION  REQUESTING PHYSICIAN: Jeffrie Oneil BROCKS, MD   Chief Complaint: Left ring finger pain  HPI: Chase Romero is a 63 y.o. male who presents with a high-pressure injection injury to the tip of the left ring finger.  This happened earlier this afternoon while using a pressure washer.  He was trying to untangle one of the hoses when the stream of water  hit him into his ring finger tip while he was wearing a glove.  He describes mild pain in the pad of the ring finger in the area of the entry wound.  He denies pain proximal to the DIP joint along the flexor surface of the finger. He denies numbness or paresthesias in the tip of the ring finger.  He denies pain in the remaining fingers.     Past Medical History:  Diagnosis Date   Hypertension    Urticaria    History reviewed. No pertinent surgical history. Social History   Socioeconomic History   Marital status: Legally Separated    Spouse name: Not on file   Number of children: Not on file   Years of education: Not on file   Highest education level: Not on file  Occupational History   Not on file  Tobacco Use   Smoking status: Every Day    Current packs/day: 0.25    Types: Cigarettes   Smokeless tobacco: Never  Vaping Use   Vaping status: Never Used  Substance and Sexual Activity   Alcohol use: Not Currently   Drug use: Never   Sexual activity: Not on file  Other Topics Concern   Not on file  Social History Narrative   Not on file   Social Drivers of Health   Financial Resource Strain: Low Risk  (08/01/2023)   Received from [facility]   Overall Financial Resource Strain (CARDIA)    Difficulty of Paying Living Expenses: Not very hard  Food Insecurity: No Food Insecurity (08/01/2023)   Received from [facility]   Hunger Vital Sign    Within the past 12 months, you worried that your food would run out before you got the money to buy more.: Never true    Within the past 12 months, the food  you bought just didn't last and you didn't have money to get more.: Never true  Transportation Needs: No Transportation Needs (08/01/2023)   Received from [facility] - Transportation    Lack of Transportation (Medical): No    Lack of Transportation (Non-Medical): No  Physical Activity: Sufficiently Active (11/22/2021)   Received from [facility]   Exercise Vital Sign    On average, how many days per week do you engage in moderate to strenuous exercise (like a brisk walk)?: 6 days    On average, how many minutes do you engage in exercise at this level?: 150+ min  Stress: Unknown (11/22/2021)   Received from [facility] - Occupational Stress Questionnaire    Feeling of Stress : Patient declined  Social Connections: Unknown (11/22/2021)   Received from [facility]   Social Connection and Isolation Panel    In a typical week, how many times do you talk on the phone with family, friends, or neighbors?: Patient declined    How often do you get together with friends or relatives?: Patient declined    How often do you attend church or religious services?: Patient declined    Active Member of Clubs or  Organizations: Not on file    How often do you attend meetings of the clubs or organizations you belong to?: Patient declined    Are you married, widowed, divorced, separated, never married, or living with a partner?: Patient declined   Family History  Problem Relation Age of Onset   Healthy Mother    CAD Father    Heart failure Father    - negative except otherwise stated in the family history section Allergies  Allergen Reactions   Gabapentin    Lisinopril Other (See Comments)   Prior to Admission medications   Medication Sig Start Date End Date Taking? Authorizing Provider  betamethasone dipropionate 0.05 % cream Apply topically. 04/19/23  Yes [provider]  Carboxymethylcellulose Sodium (DRY EYE RELIEF OP) Place 1 drop  into both eyes daily.   Yes [provider]  cyclobenzaprine (FLEXERIL) 10 MG tablet Take 5-10 mg by mouth daily as needed for muscle spasms. 05/30/21  Yes [provider]  hydrochlorothiazide (HYDRODIURIL) 25 MG tablet Take 25 mg by mouth daily.   Yes [provider]  ibuprofen  (ADVIL ) 600 MG tablet Take 1 tablet (600 mg total) by mouth every 6 (six) hours as needed. 09/28/20  Yes Van Knee, MD  losartan  (COZAAR ) 100 MG tablet Take 100 mg by mouth daily.   Yes [provider]  QUEtiapine (SEROQUEL) 100 MG tablet Take 50-100 mg by mouth at bedtime. 05/03/23  Yes [provider]  terbinafine (LAMISIL) 1 % cream Apply 1 Application topically daily as needed (rash). 05/25/21  Yes [provider]  triamcinolone  ointment (KENALOG ) 0.1 % Apply 1 Application topically 2 (two) times daily as needed (rash flare). Do not use on the face, neck, armpits or groin area. Do not use more than 3 weeks in a row. 06/06/23  Yes Luke Orlan HERO, DO  traMADol (ULTRAM) 50 MG tablet Take 50 mg by mouth every 6 (six) hours as needed for moderate pain (pain score 4-6). Patient not taking: Reported on 09/08/2024 01/18/23   [provider]   DG Chest Portable 1 View Result Date: 09/08/2024 CLINICAL DATA:  cp EXAM: PORTABLE CHEST - 1 VIEW COMPARISON:  01/30/2016 FINDINGS: No focal airspace consolidation, pleural effusion, or pneumothorax. No cardiomegaly. Tortuous aorta with aortic atherosclerosis. No acute fracture or destructive lesions. Multilevel thoracic osteophytosis. IMPRESSION: No acute cardiopulmonary abnormality. Electronically Signed   By: Rogelia Myers M.D.   On: 09/08/2024 17:15   DG Finger Ring Left Result Date: 09/08/2024 CLINICAL DATA:  injury. EXAM: LEFT RING FINGER 2+V COMPARISON:  None Available. FINDINGS: No acute fracture or dislocation. No aggressive osseous lesion. No significant degenerative changes of imaged joints. No radiopaque foreign  bodies. Soft tissues are within normal limits. IMPRESSION: *No acute osseous abnormality of the left ring finger. Electronically Signed   By: Ree Molt M.D.   On: 09/08/2024 15:53   - Positive ROS: All other systems have been reviewed and were otherwise negative with the exception of those mentioned in the HPI and as above.  Physical Exam: General: No acute distress, resting comfortably Cardiovascular: BUE warm and well perfused, normal rate Respiratory: Normal WOB on RA Skin: Warm and dry Neurologic: Sensation intact distally Psychiatric: Patient is at baseline mood and affect  Left Upper Extremity  Small, subcentimeter wound at the tip of the ring finger distal to the DIP flexion crease.  There is mild surrounding swelling.  There is no erythema.  There is no swelling proximal to the DIP flexion crease.  He is TTP at the tip of the ring finger but non tender proximal to the DIP flexion crease along the flexor tendon sheath.  He has full active flexion of all fingers, including the ring finger, at the MCP, PIP, and DIP joints.  He has normal sensation to light touch along the radial and ulnar border of the ring finger.  All fingers are pink and well perfused.    Assessment: 63 yo M w/ pressure injection injury to the tip of the ring finger at the pad.  The injection was with water  only.  He has no evidence of flexor sheath involvement, finger compartment syndrome, or neurovascular compromise.   Plan: I reviewed the nature of high pressure injection injuries with the patient at length.  Given that he has minimal swelling, no vascular compromise, no digital nerve injury, and full AROM with minimal discomfort, we will monitor things for now.  We reviewed concerning signs and symptoms to look for including worsening pain, swelling, or change in the color of the finger tip.   He's being admitted for likely cardiac catheterization in the morning.    Thank you for the consult and the  opportunity to see Mr. Victory Bebe Galla, M.D. [facility] 9:00 PM

## 2024-09-08 NOTE — ED Triage Notes (Signed)
 Pt extremely unbalanced walking into triage; denies etoh.    Reports L hand ring finger injury while using pressure washer appx 15 min pta.

## 2024-09-08 NOTE — ED Provider Notes (Signed)
 [place] [facility] Provider Note   CSN: [id number] Arrival date & time: 09/08/24  1457     Patient presents with: Hand Injury   Chase Romero is a 63 y.o. male.   HPI      63 year old male with a history of hypertension presents with concern for pressure washer injury, and development of chest pain.  Reports that he has been pressure washing all day (5 hours), and just prior to arrival the pressure washer hit his left hand, going through his glove at the tip of his left ring finger causing a small wound.  Reports initially he had loss of sensation to the tip of his finger, and it had a white appearance.  Reports he has pain at the tip of his finger which was severe, continues to have some altered sensation but has improved from initial evaluation.  He is able to move his finger but reports pain.   He then developed chest pain and shortness of breath.  He describes the chest pain as left-sided and shortness of breath that he feels is due to anxiety due to his finger pain.  Felt like he was hyperventilating from it all.  He denies associated nausea, diaphoresis.  He has felt lightheaded in the setting of his finger pain.  He denies any prior chest pain or exertional chest pain prior to this event. Reports he was feeling lightheaded as he was walking and hyperventilating that made him look off balance. Denies etoh or other medication use today.   He does have history of smoking. Father has hx of atherosclerosis including bypass of legs and CABG.    Past Medical History:  Diagnosis Date   Hypertension    Urticaria     History reviewed. No pertinent surgical history.  Prior to Admission medications   Medication Sig Start Date End Date Taking? Authorizing Provider  betamethasone dipropionate 0.05 % cream Apply topically. 04/19/23   [provider]  cyclobenzaprine (FLEXERIL) 10 MG tablet Take by mouth. 05/30/21   [provider]   famotidine  (PEPCID ) 20 MG tablet Take 1 tablet (20 mg total) by mouth 2 (two) times daily. 05/07/23   Luke Orlan HERO, DO  fexofenadine  (ALLEGRA ) 180 MG tablet Take 1 tablet (180 mg total) by mouth in the morning and at bedtime. 06/06/23   Luke Orlan HERO, DO  Fluocinolone  Acetonide Scalp 0.01 % OIL Apply to scalp area where you are itchy and leave overnight or over 4 hours and then wash it off. Use it only as needed. 06/06/23   Luke Orlan HERO, DO  hydrochlorothiazide (HYDRODIURIL) 25 MG tablet Take 25 mg by mouth daily.    [provider]  ibuprofen  (ADVIL ) 600 MG tablet Take 1 tablet (600 mg total) by mouth every 6 (six) hours as needed. 09/28/20   Van Knee, MD  losartan  (COZAAR ) 100 MG tablet Take 100 mg by mouth daily.    [provider]  QUEtiapine (SEROQUEL) 100 MG tablet Take by mouth. 05/03/23   [provider]  terbinafine (LAMISIL) 1 % cream Apply topically. 05/25/21   [provider]  tiZANidine  (ZANAFLEX ) 4 MG tablet Take 1 tablet (4 mg total) by mouth every 8 (eight) hours as needed for muscle spasms. 09/28/20   Van Knee, MD  traMADol (ULTRAM) 50 MG tablet Take by mouth. 01/18/23   [provider]  triamcinolone  ointment (KENALOG ) 0.1 % Apply 1 Application topically 2 (two) times daily as needed (rash flare). Do  not use on the face, neck, armpits or groin area. Do not use more than 3 weeks in a row. 06/06/23   Luke Orlan HERO, DO    Allergies: Gabapentin and Lisinopril    Review of Systems  Updated Vital Signs BP (!) 136/91   Pulse 87   Temp 97.7 F (36.5 C) (Oral)   Resp 18   Ht 5' 10 (1.778 m)   Wt 99.8 kg   SpO2 97%   BMI 31.57 kg/m   Physical Exam Vitals and nursing note reviewed.  Constitutional:      General: He is not in acute distress.    Appearance: He is well-developed. He is not diaphoretic.  HENT:     Head: Normocephalic and atraumatic.  Eyes:     Conjunctiva/sclera: Conjunctivae normal.  Cardiovascular:      Rate and Rhythm: Regular rhythm. Tachycardia present.     Heart sounds: Normal heart sounds. No murmur heard.    No friction rub. No gallop.  Pulmonary:     Effort: Pulmonary effort is normal. No respiratory distress.     Breath sounds: Normal breath sounds. No wheezing or rales.  Abdominal:     General: There is no distension.     Palpations: Abdomen is soft.     Tenderness: There is no abdominal tenderness. There is no guarding.  Musculoskeletal:     Cervical back: Normal range of motion.     Comments: Left ring finger with pressure washer injury 2mm wound, ?portion of glove/FB, normal cap refill, tenderness to distal portion of finger, no extension of tenderness proximally, able to flex and extend fingers, altered sensation to tip, normal color at this time (pt reports turned white at home)  Skin:    General: Skin is warm and dry.  Neurological:     Mental Status: He is alert and oriented to person, place, and time.     (all labs ordered are listed, but only abnormal results are displayed) Labs Reviewed  CBC WITH DIFFERENTIAL/PLATELET - Abnormal; Notable for the following components:      Result Value   WBC 12.5 (*)    RBC 5.82 (*)    Neutro Abs 9.5 (*)    Abs Immature Granulocytes 0.08 (*)    All other components within normal limits  COMPREHENSIVE METABOLIC PANEL WITH GFR - Abnormal; Notable for the following components:   CO2 20 (*)    BUN 25 (*)    Creatinine, Ser 1.67 (*)    Albumin 5.1 (*)    GFR, Estimated 46 (*)    Anion gap 18 (*)    All other components within normal limits  TROPONIN T, HIGH SENSITIVITY - Abnormal; Notable for the following components:   Troponin T High Sensitivity 85 (*)    All other components within normal limits  TROPONIN T, HIGH SENSITIVITY    EKG: EKG Interpretation Date/Time:  Tuesday September 08 2024 16:30:00 EST Ventricular Rate:  88 PR Interval:  222 QRS Duration:  112 QT Interval:  374 QTC Calculation: 453 R  Axis:   102  Text Interpretation: Sinus rhythm Borderline prolonged PR interval St segments isoelectric with TP axis as baseline, has PR depressions Confirmed by Dreama Longs ([id number]) on 09/08/2024 5:51:49 PM  Radiology: [facility] Chest Portable 1 View Result Date: 09/08/2024 CLINICAL DATA:  cp EXAM: PORTABLE CHEST - 1 VIEW COMPARISON:  01/30/2016 FINDINGS: No focal airspace consolidation, pleural effusion, or pneumothorax. No cardiomegaly. Tortuous aorta with aortic atherosclerosis. No acute fracture or destructive  lesions. Multilevel thoracic osteophytosis. IMPRESSION: No acute cardiopulmonary abnormality. Electronically Signed   By: Rogelia Myers M.D.   On: 09/08/2024 17:15   DG Finger Ring Left Result Date: 09/08/2024 CLINICAL DATA:  injury. EXAM: LEFT RING FINGER 2+V COMPARISON:  None Available. FINDINGS: No acute fracture or dislocation. No aggressive osseous lesion. No significant degenerative changes of imaged joints. No radiopaque foreign bodies. Soft tissues are within normal limits. IMPRESSION: *No acute osseous abnormality of the left ring finger. Electronically Signed   By: Ree Molt M.D.   On: 09/08/2024 15:53     .Critical Care  Performed by: Dreama Longs, MD Authorized by: Dreama Longs, MD   Critical care provider statement:    Critical care time (minutes):  30   Critical care was time spent personally by me on the following activities:  Development of treatment plan with patient or surrogate, discussions with consultants, evaluation of patient's response to treatment, examination of patient, ordering and review of laboratory studies, ordering and review of radiographic studies, ordering and performing treatments and interventions, pulse oximetry, re-evaluation of patient's condition and review of old charts    Medications Ordered in the ED  acetaminophen  (TYLENOL ) tablet 650 mg (650 mg Oral Given 09/08/24 1520)  aspirin  chewable tablet 324 mg (324 mg Oral Given  09/08/24 1631)                                     63 year old male with a history of hypertension presents with concern for pressure washer injury, and development of chest pain.  Regarding finger--XR without acute abnormalities. Normal cap refill, reports was water  and possible mild soap--consulted Dr. Romona, hand surgery. He recommends consultation on [facility] to [facility].  Developed chest pain on arrival to the ED which he felt was hyperventilating in setting of finger pain and injury> ECG with technically PR depressions when measuring ST elevation from TP axis however appearance of STE inferiorly without reciprocal change.  Using TP axis as baseline did not feel this met STEMI criteria and ordered troponin.   Troponin returned elevated to 85. Discussed with Dr. Wonda (Dr. Ladona in another procedure)--Chase Romero is chest pain free at this time will not call STEMI, however given history, troponin, ECG will have him transferred to [facility] for Cardiology evaluation. He has been given aspirin .  Did not start heparin  at this time given history, possibility that troponin may be secondary to tachycardia/CKD--however discussed with patient his symptoms may be ACS, and we may start this depending on second troponin and/or cardiology recommendations. Discussed with patient we do anticipate admission and possibility of cardiac catheterization tonight or tomorrow.    Transferred to [facility] ED for Cardiology and Hand Surgery evaluation.       CXR without acute abnormalities. Other labs show Cr 1.67, mild leukocytosis.     Final diagnoses:  None    ED Discharge Orders     None          Dreama Longs, MD 09/08/24 [phone]

## 2024-09-08 NOTE — ED Notes (Signed)
 Calleed [facility] @16 :39 for transport to [facility] ED.  Spoke with Luke

## 2024-09-08 NOTE — H&P (Addendum)
 Cardiology Admission History and Physical   Patient ID: Chase Romero MRN: [id number]; DOB: January 14, 1961   Admission date: 09/08/2024  PCP:  [facility] Providers Cardiologist:  None     Chief Complaint: Chest pain  Patient Profile: Chase Romero is a 63 y.o. male with hypertension, hyperlipidemia, current alcohol and tobacco use, who is being seen 09/08/2024 for the evaluation of chest pain.  History of Present Illness: Chase Romero has past medical history as listed above.  He presented to the emergency department at [facility] on 09/08/2024 after concerns for a pressure washer injury to his hand.  He reported that he had been pressure washing for about 5 hours when the pressure washer hit his left hand causing a small wound.  During this injury, he developed sudden onset of chest pain and shortness of breath.  He thought originally it was anxiety due to the pain in his finger.  He denied associated nausea, vomiting, diaphoresis.  He did note feeling lightheaded however he attributed this to the pain in his finger.  Relevant workup while in the ED included: CBC showed leukocytosis at 12.5, metabolic panel showed creatinine at 1.67, previously noted to be 1.06 in August 2025.  Troponin 85 ? 78.  EKG showed: Sinus tachycardia HR 111, reviewed by Dr. Wonda and concerning for ischemic appearing changes, there are no prior EKGs to compare.  Patient was loaded with aspirin  324 mg, case was discussed with on-call STEMI doctor who determined it was not a code STEMI but recommended transfer to [facility] emergency department for possible cardiac catheterization.  Upon speaking with the patient he admits to having hypertension, hyperlipidemia, currently uses both alcohol and tobacco products.  Denies any sort of ischemic workup in the past.  Reports history of coronary vascular disease with his father, reporting that he died from this.  We discussed cardiac  catheterization in the morning, I will discuss further with MD to ensure that this is an appropriate plan.   Past Medical History:  Diagnosis Date   Hypertension    Urticaria    History reviewed. No pertinent surgical history.   Medications Prior to Admission: Prior to Admission medications   Medication Sig Start Date End Date Taking? Authorizing Provider  betamethasone dipropionate 0.05 % cream Apply topically. 04/19/23   [provider]  cyclobenzaprine (FLEXERIL) 10 MG tablet Take by mouth. 05/30/21   [provider]  famotidine  (PEPCID ) 20 MG tablet Take 1 tablet (20 mg total) by mouth 2 (two) times daily. 05/07/23   Luke Orlan HERO, DO  fexofenadine  (ALLEGRA ) 180 MG tablet Take 1 tablet (180 mg total) by mouth in the morning and at bedtime. 06/06/23   Luke Orlan HERO, DO  Fluocinolone  Acetonide Scalp 0.01 % OIL Apply to scalp area where you are itchy and leave overnight or over 4 hours and then wash it off. Use it only as needed. 06/06/23   Luke Orlan HERO, DO  hydrochlorothiazide (HYDRODIURIL) 25 MG tablet Take 25 mg by mouth daily.    [provider]  ibuprofen  (ADVIL ) 600 MG tablet Take 1 tablet (600 mg total) by mouth every 6 (six) hours as needed. 09/28/20   Van Knee, MD  losartan  (COZAAR ) 100 MG tablet Take 100 mg by mouth daily.    [provider]  QUEtiapine (SEROQUEL) 100 MG tablet Take by mouth. 05/03/23   [provider]  terbinafine (LAMISIL) 1 % cream Apply topically. 05/25/21   [provider]  tiZANidine  (ZANAFLEX ) 4 MG tablet Take 1 tablet (4 mg total) by mouth every 8 (eight) hours as needed for muscle spasms. 09/28/20   Van Knee, MD  traMADol (ULTRAM) 50 MG tablet Take by mouth. 01/18/23   [provider]  triamcinolone  ointment (KENALOG ) 0.1 % Apply 1 Application topically 2 (two) times daily as needed (rash flare). Do not use on the face, neck, armpits or groin area. Do not use more than 3 weeks in a row.  06/06/23   Luke Orlan HERO, DO    Allergies:    Allergies  Allergen Reactions   Gabapentin    Lisinopril Other (See Comments)   Social History:   Social History   Socioeconomic History   Marital status: Legally Separated    Spouse name: Not on file   Number of children: Not on file   Years of education: Not on file   Highest education level: Not on file  Occupational History   Not on file  Tobacco Use   Smoking status: Every Day    Current packs/day: 0.25    Types: Cigarettes   Smokeless tobacco: Never  Vaping Use   Vaping status: Never Used  Substance and Sexual Activity   Alcohol use: Not Currently   Drug use: Never   Sexual activity: Not on file  Other Topics Concern   Not on file  Social History Narrative   Not on file   Social Drivers of Health   Financial Resource Strain: Low Risk  (08/01/2023)   Received from [facility]   Overall Financial Resource Strain (CARDIA)    Difficulty of Paying Living Expenses: Not very hard  Food Insecurity: No Food Insecurity (08/01/2023)   Received from [facility]   Hunger Vital Sign    Within the past 12 months, you worried that your food would run out before you got the money to buy more.: Never true    Within the past 12 months, the food you bought just didn't last and you didn't have money to get more.: Never true  Transportation Needs: No Transportation Needs (08/01/2023)   Received from [facility] - Transportation    Lack of Transportation (Medical): No    Lack of Transportation (Non-Medical): No  Physical Activity: Sufficiently Active (11/22/2021)   Received from [facility]   Exercise Vital Sign    On average, how many days per week do you engage in moderate to strenuous exercise (like a brisk walk)?: 6 days    On average, how many minutes do you engage in exercise at this level?: 150+ min  Stress: Unknown (11/22/2021)   Received from [facility] -  Occupational Stress Questionnaire    Feeling of Stress : Patient declined  Social Connections: Unknown (11/22/2021)   Received from [facility]   Social Connection and Isolation Panel    In a typical week, how many times do you talk on the phone with family, friends, or neighbors?: Patient declined    How often do you get together with friends or relatives?: Patient declined    How often do you attend church or religious services?: Patient declined    Active Member of Clubs or Organizations: Not on file    How often do you attend meetings of the clubs or organizations you belong to?: Patient declined    Are you married, widowed, divorced, separated, never married, or living with a partner?: Patient declined  Intimate Partner Violence: Unknown (  11/22/2021)   Received from [facility]   Humiliation, Afraid, Rape, and Kick questionnaire    Within the last year, have you been afraid of your partner or ex-partner?: Patient declined    Within the last year, have you been humiliated or emotionally abused in other ways by your partner or ex-partner?: Patient declined    Within the last year, have you been kicked, hit, slapped, or otherwise physically hurt by your partner or ex-partner?: Patient declined    Within the last year, have you been raped or forced to have any kind of sexual activity by your partner or ex-partner?: Patient declined    Family History:   The patient's family history includes CAD in his father; Healthy in his mother; Heart failure in his father.    ROS:  Please see the history of present illness.  All other ROS reviewed and negative.     Physical Exam/Data: Vitals:   09/08/24 1700 09/08/24 1900 09/08/24 1930 09/08/24 1939  BP: (!) 136/91 (!) 147/85 (!) 121/91   Pulse: 87 79 75   Resp: 18 (!) 22 16   Temp:    98 F (36.7 C)  TempSrc:    Oral  SpO2: 97% 96% 95%   Weight:      Height:       No intake or output data in the 24 hours ending 09/08/24 1949     09/08/2024    3:03 PM 05/07/2023   10:09 AM 07/23/2018   12:11 AM  Last 3 Weights  Weight (lbs) 220 lb 210 lb 12.8 oz 225 lb  Weight (kg) 99.791 kg 95.618 kg 102.059 kg     Body mass index is 31.57 kg/m.   General:  Well nourished, well developed, in no acute distress HEENT: normal Neck: no JVD Vascular: No carotid bruits; Distal pulses 2+ bilaterally   Cardiac:  normal S1, S2; RRR; no murmur  Lungs:  clear to auscultation bilaterally, no wheezing, rhonchi or rales  Abd: soft, nontender, no hepatomegaly  Ext: no edema Musculoskeletal:  No deformities, BUE and BLE strength normal and equal Skin: warm and dry  Neuro:  CNs 2-12 intact, no focal abnormalities noted Psych:  Normal affect    Relevant CV Studies:  Echocardiogram, 09/09/2024 Ordered, pending results  Laboratory Data: High Sensitivity Troponin:  No results for input(s): TROPONINIHS in the last 720 hours.    Chemistry Recent Labs  Lab 09/08/24 1522  NA 142  K 3.7  CL 105  CO2 20*  GLUCOSE 88  BUN 25*  CREATININE 1.67*  CALCIUM  10.0  GFRNONAA 46*  ANIONGAP 18*    Recent Labs  Lab 09/08/24 1522  PROT 8.0  ALBUMIN 5.1*  AST 32  ALT 29  ALKPHOS 86  BILITOT 0.5   Lipids No results for input(s): CHOL, TRIG, HDL, LABVLDL, LDLCALC, CHOLHDL in the last 168 hours. Hematology Recent Labs  Lab 09/08/24 1522  WBC 12.5*  RBC 5.82*  HGB 16.7  HCT 48.7  MCV 83.7  MCH 28.7  MCHC 34.3  RDW 13.2  PLT 286   Thyroid No results for input(s): TSH, FREET4 in the last 168 hours. BNPNo results for input(s): BNP, PROBNP in the last 168 hours.  DDimer No results for input(s): DDIMER in the last 168 hours.  Radiology/Studies:  DG Chest Portable 1 View Result Date: 09/08/2024 CLINICAL DATA:  cp EXAM: PORTABLE CHEST - 1 VIEW COMPARISON:  01/30/2016 FINDINGS: No focal airspace consolidation, pleural effusion, or pneumothorax. No cardiomegaly. Tortuous aorta  with aortic atherosclerosis. No  acute fracture or destructive lesions. Multilevel thoracic osteophytosis. IMPRESSION: No acute cardiopulmonary abnormality. Electronically Signed   By: Rogelia Myers M.D.   On: 09/08/2024 17:15   DG Finger Ring Left Result Date: 09/08/2024 CLINICAL DATA:  injury. EXAM: LEFT RING FINGER 2+V COMPARISON:  None Available. FINDINGS: No acute fracture or dislocation. No aggressive osseous lesion. No significant degenerative changes of imaged joints. No radiopaque foreign bodies. Soft tissues are within normal limits. IMPRESSION: *No acute osseous abnormality of the left ring finger. Electronically Signed   By: Ree Molt M.D.   On: 09/08/2024 15:53   Assessment and Plan:  NSTEMI Presented to ED with hand injury after pressure washing Noted to have sudden onset chest pain, shortness of breath Troponin 85 ? 78 EKG with possible ischemic changes, no prior EKGs to compare No active chest pain Loaded with ASA 325 mg Risk factors: Hypertension, hyperlipidemia, alcohol use, tobacco abuse, family history in father Creatinine significantly elevated when compared to August 2025 Plan for left heart cath in the morning NPO at midnight  Start IV heparin  Order echocardiogram Order lipid panel, A1c, TSH  Informed Consent   Shared Decision Making/Informed Consent The risks [stroke (1 in 1000), death (1 in 1000), kidney failure [usually temporary] (1 in 500), bleeding (1 in 200), allergic reaction [possibly serious] (1 in 200)], benefits (diagnostic support and management of coronary artery disease) and alternatives of a cardiac catheterization were discussed in detail with Chase Romero and he is willing to proceed.      Hypertension Home meds: Losartan  100 mg daily, hydrochlorothiazide 25 mg daily BP on admission 119/105 Most recent BP 121/91 Not given anything for hypertension up until this point  Hyperlipidemia 09/08/2024: ALT 29  Reports that he was previously on cholesterol medications but  stopped Denies any known allergies or myalgias Add Crestor  20 mg  Check lipid panel in AM  Risk Assessment/Risk Scores:   TIMI Risk Score for Unstable Angina or Non-ST Elevation MI:   The patient's TIMI risk score is 2, which indicates a 8% risk of all cause mortality, new or recurrent myocardial infarction or need for urgent revascularization in the next 14 days.    Code Status: Full Code  Severity of Illness: The appropriate patient status for this patient is OBSERVATION. Observation status is judged to be reasonable and necessary in order to provide the required intensity of service to ensure the patient's safety. The patient's presenting symptoms, physical exam findings, and initial radiographic and laboratory data in the context of their medical condition is felt to place them at decreased risk for further clinical deterioration. Furthermore, it is anticipated that the patient will be medically stable for discharge from the hospital within 2 midnights of admission.    For questions or updates, please contact [facility] Please consult [email or web address] for contact info under    Signed, Chase DELENA Donath, PA-C  09/08/2024 7:49 PM   Personally seen and examined. Agree with above.  63 year old with strong family history of CAD with his father having bypass surgery earlier today was pressure washing, injured his hand then went to the emergency department was anxious and had sudden onset chest pain and shortness of breath.  EKG was performed that showed subtle ST elevations in the inferior leads.  Dr. Wonda with interventional team was called for possible code STEMI.  He was not brought in for a code STEMI but transferred over for further admission.  He is a heavy smoker.  Nondiabetic.  Has hypertension and hyperlipidemia.  Creatinine 1.68 currently, was 1.0 back in August.  May have mild acute kidney injury.  He is currently regular rate and rhythm, alert and oriented x 3 in no  acute distress, wife at bedside.  No edema, no rashes.  Troponin increased to 85  Non-ST elevation myocardial infarction - Mildly increased troponin, abnormal EKG with ischemic changes, smoker, strong family history, hyperlipidemia, hypertension.  I think it makes sense for us  to proceed with cardiac catheterization.  Risk and benefits have been explained including stroke heart attack death renal impairment bleeding.  He is willing to proceed.  We will hydrate aggressively overnight.  Currently chest pain-free.  IV heparin .  Hyperlipidemia - Has been on statin in the past but has not been taking recently.  We will go and start Crestor  20 mg.  LDL goal less than 55.  Check LP(a) tomorrow morning.  Hypertension - Previously on thiazide.  Hold currently.  Hydrate.  Smoking - Tobacco cessation discussed.  Oneil Parchment, MD

## 2024-09-09 ENCOUNTER — Other Ambulatory Visit (HOSPITAL_COMMUNITY): Payer: Self-pay

## 2024-09-09 ENCOUNTER — Inpatient Hospital Stay (HOSPITAL_COMMUNITY)

## 2024-09-09 ENCOUNTER — Encounter (HOSPITAL_COMMUNITY)
Admission: EM | Disposition: A | Discharge status: Home / Self Care | Payer: Self-pay | Source: Home / Self Care | Attending: Cardiology

## 2024-09-09 DIAGNOSIS — E785 Hyperlipidemia, unspecified: Secondary | ICD-10-CM | POA: Insufficient documentation

## 2024-09-09 DIAGNOSIS — I2510 Atherosclerotic heart disease of native coronary artery without angina pectoris: Secondary | ICD-10-CM

## 2024-09-09 DIAGNOSIS — I214 Non-ST elevation (NSTEMI) myocardial infarction: Secondary | ICD-10-CM | POA: Diagnosis not present

## 2024-09-09 DIAGNOSIS — I10 Essential (primary) hypertension: Secondary | ICD-10-CM | POA: Insufficient documentation

## 2024-09-09 DIAGNOSIS — N179 Acute kidney failure, unspecified: Secondary | ICD-10-CM

## 2024-09-09 DIAGNOSIS — S6990XA Unspecified injury of unspecified wrist, hand and finger(s), initial encounter: Secondary | ICD-10-CM | POA: Insufficient documentation

## 2024-09-09 DIAGNOSIS — Z720 Tobacco use: Secondary | ICD-10-CM | POA: Insufficient documentation

## 2024-09-09 HISTORY — PX: LEFT HEART CATH AND CORONARY ANGIOGRAPHY: CATH118249

## 2024-09-09 LAB — ECHOCARDIOGRAM COMPLETE
AR max vel: 3.09 cm2
AV Area VTI: 3.11 cm2
AV Area mean vel: 2.9 cm2
AV Mean grad: 4 mmHg
AV Peak grad: 7.2 mmHg
Ao pk vel: 1.34 m/s
Area-P 1/2: 3.89 cm2
Height: 70 in
S' Lateral: 3 cm
Single Plane A4C EF: 61.7 %
Weight: 3520 [oz_av]

## 2024-09-09 LAB — CBC
HCT: 42.6 % (ref 39.0–52.0)
Hemoglobin: 14.2 g/dL (ref 13.0–17.0)
MCH: 28.8 pg (ref 26.0–34.0)
MCHC: 33.3 g/dL (ref 30.0–36.0)
MCV: 86.4 fL (ref 80.0–100.0)
Platelets: 209 10*3/uL (ref 150–400)
RBC: 4.93 MIL/uL (ref 4.22–5.81)
RDW: 13.5 % (ref 11.5–15.5)
WBC: 9.2 10*3/uL (ref 4.0–10.5)
nRBC: 0 % (ref 0.0–0.2)

## 2024-09-09 LAB — LIPID PANEL
Cholesterol: 182 mg/dL (ref 0–200)
HDL: 41 mg/dL (ref >40)
LDL Cholesterol: 127 mg/dL — ABNORMAL HIGH (ref 0–99)
Total CHOL/HDL Ratio: 4.4 ratio
Triglycerides: 72 mg/dL (ref <150)
VLDL: 14 mg/dL (ref 0–40)

## 2024-09-09 LAB — BASIC METABOLIC PANEL WITH GFR
Anion gap: 10 (ref 5–15)
BUN: 23 mg/dL (ref 8–23)
CO2: 22 mmol/L (ref 22–32)
Calcium: 8.3 mg/dL — ABNORMAL LOW (ref 8.9–10.3)
Chloride: 105 mmol/L (ref 98–111)
Creatinine, Ser: 1.24 mg/dL (ref 0.61–1.24)
GFR, Estimated: >60 mL/min (ref >60)
Glucose, Bld: 88 mg/dL (ref 70–99)
Potassium: 3.9 mmol/L (ref 3.5–5.1)
Sodium: 137 mmol/L (ref 135–145)

## 2024-09-09 LAB — RAPID URINE DRUG SCREEN, HOSP PERFORMED
Amphetamines: NOT DETECTED
Barbiturates: NOT DETECTED
Benzodiazepines: POSITIVE — AB
Cocaine: NOT DETECTED
Opiates: NOT DETECTED
Tetrahydrocannabinol: POSITIVE — AB

## 2024-09-09 LAB — HEPARIN LEVEL (UNFRACTIONATED): Heparin Unfractionated: 0.27 [IU]/mL — ABNORMAL LOW (ref 0.30–0.70)

## 2024-09-09 MED ORDER — LOSARTAN POTASSIUM 25 MG PO TABS
25.0000 mg | ORAL_TABLET | Freq: Every day | ORAL | Status: DC
  Administered 2024-09-09: 25 mg via ORAL
  Filled 2024-09-09: qty 1

## 2024-09-09 MED ORDER — METOPROLOL TARTRATE 25 MG PO TABS
25.0000 mg | ORAL_TABLET | Freq: Two times a day (BID) | ORAL | Status: DC
  Administered 2024-09-09: 25 mg via ORAL
  Filled 2024-09-09: qty 1

## 2024-09-09 MED ORDER — MIDAZOLAM HCL (PF) 2 MG/2ML IJ SOLN
INTRAMUSCULAR | Status: DC | PRN
  Administered 2024-09-09: 2 mg via INTRAVENOUS

## 2024-09-09 MED ORDER — LABETALOL HCL 5 MG/ML IV SOLN: 10.0000 mg | INTRAVENOUS | Status: AC | PRN

## 2024-09-09 MED ORDER — SODIUM CHLORIDE 0.9% FLUSH: 3.0000 mL | INTRAVENOUS | Status: DC | PRN

## 2024-09-09 MED ORDER — CLOPIDOGREL BISULFATE 75 MG PO TABS
75.0000 mg | ORAL_TABLET | Freq: Every day | ORAL | 3 refills | Status: DC
  Filled 2024-09-09: qty 30, 30d supply, fill #0

## 2024-09-09 MED ORDER — VERAPAMIL HCL 2.5 MG/ML IV SOLN
INTRAVENOUS | Status: DC | PRN
  Administered 2024-09-09: 10 mL via INTRA_ARTERIAL

## 2024-09-09 MED ORDER — HEPARIN SODIUM (PORCINE) 1000 UNIT/ML IJ SOLN
INTRAMUSCULAR | Status: DC | PRN
  Administered 2024-09-09: 5000 [IU] via INTRAVENOUS

## 2024-09-09 MED ORDER — HEPARIN (PORCINE) IN NACL 1000-0.9 UT/500ML-% IV SOLN
INTRAVENOUS | Status: DC | PRN
  Administered 2024-09-09 (×2): 500 mL

## 2024-09-09 MED ORDER — CLOPIDOGREL BISULFATE 75 MG PO TABS
300.0000 mg | ORAL_TABLET | Freq: Once | ORAL | Status: AC
  Administered 2024-09-09: 300 mg via ORAL
  Filled 2024-09-09: qty 4

## 2024-09-09 MED ORDER — FREE WATER
500.0000 mL | Freq: Once | Status: AC
  Administered 2024-09-09: 500 mL via ORAL

## 2024-09-09 MED ORDER — LIDOCAINE HCL (PF) 1 % IJ SOLN
INTRAMUSCULAR | Status: DC | PRN
  Administered 2024-09-09: 2 mL

## 2024-09-09 MED ORDER — FENTANYL CITRATE (PF) 100 MCG/2ML IJ SOLN
INTRAMUSCULAR | Status: DC | PRN
  Administered 2024-09-09: 25 ug via INTRAVENOUS

## 2024-09-09 MED ORDER — MIDAZOLAM HCL 2 MG/2ML IJ SOLN
INTRAMUSCULAR | Status: AC
  Filled 2024-09-09: qty 2

## 2024-09-09 MED ORDER — NITROGLYCERIN 0.4 MG SL SUBL
0.4000 mg | SUBLINGUAL_TABLET | SUBLINGUAL | 2 refills | Status: AC | PRN
  Filled 2024-09-09: qty 25, 8d supply, fill #0

## 2024-09-09 MED ORDER — LIDOCAINE HCL (PF) 1 % IJ SOLN
INTRAMUSCULAR | Status: AC
  Filled 2024-09-09: qty 30

## 2024-09-09 MED ORDER — ASPIRIN 81 MG PO TBEC
81.0000 mg | DELAYED_RELEASE_TABLET | Freq: Every day | ORAL | 3 refills | Status: AC
  Filled 2024-09-09: qty 30, 30d supply, fill #0

## 2024-09-09 MED ORDER — FENTANYL CITRATE (PF) 100 MCG/2ML IJ SOLN
INTRAMUSCULAR | Status: AC
  Filled 2024-09-09: qty 2

## 2024-09-09 MED ORDER — CLOPIDOGREL BISULFATE 75 MG PO TABS: 75.0000 mg | ORAL_TABLET | Freq: Every day | ORAL | Status: DC

## 2024-09-09 MED ORDER — HYDRALAZINE HCL 20 MG/ML IJ SOLN: 10.0000 mg | INTRAMUSCULAR | Status: AC | PRN

## 2024-09-09 MED ORDER — METOPROLOL TARTRATE 25 MG PO TABS
25.0000 mg | ORAL_TABLET | Freq: Two times a day (BID) | ORAL | 1 refills | Status: DC
  Filled 2024-09-09: qty 60, 30d supply, fill #0

## 2024-09-09 MED ORDER — ROSUVASTATIN CALCIUM 20 MG PO TABS
20.0000 mg | ORAL_TABLET | Freq: Every day | ORAL | 2 refills | Status: DC
  Filled 2024-09-09: qty 30, 30d supply, fill #0

## 2024-09-09 MED ORDER — VERAPAMIL HCL 2.5 MG/ML IV SOLN
INTRAVENOUS | Status: AC
Start: 2024-09-09 — End: 2024-09-09
  Filled 2024-09-09: qty 2

## 2024-09-09 MED ORDER — ASPIRIN 81 MG PO CHEW
81.0000 mg | CHEWABLE_TABLET | ORAL | Status: AC
  Administered 2024-09-09: 81 mg via ORAL
  Filled 2024-09-09: qty 1

## 2024-09-09 MED ORDER — IOHEXOL 350 MG/ML SOLN
INTRAVENOUS | Status: DC | PRN
  Administered 2024-09-09: 65 mL

## 2024-09-09 MED ORDER — LOSARTAN POTASSIUM 25 MG PO TABS
25.0000 mg | ORAL_TABLET | Freq: Every day | ORAL | 1 refills | Status: DC
  Filled 2024-09-09: qty 90, 90d supply, fill #0

## 2024-09-09 MED ORDER — HEPARIN SODIUM (PORCINE) 1000 UNIT/ML IJ SOLN
INTRAMUSCULAR | Status: AC
Start: 2024-09-09 — End: 2024-09-09
  Filled 2024-09-09: qty 10

## 2024-09-09 MED ORDER — SODIUM CHLORIDE 0.9 % IV SOLN: 250.0000 mL | INTRAVENOUS | Status: DC | PRN

## 2024-09-09 MED ORDER — SODIUM CHLORIDE 0.9% FLUSH
3.0000 mL | Freq: Two times a day (BID) | INTRAVENOUS | Status: DC
  Administered 2024-09-09: 3 mL via INTRAVENOUS

## 2024-09-09 NOTE — Progress Notes (Signed)
 ANTICOAGULATION CONSULT NOTE  Pharmacy Consult for Heparin  Indication: chest pain/ACS  Allergies  Allergen Reactions   Gabapentin    Lisinopril Other (See Comments)    Patient Measurements: Height: 5' 10 (177.8 cm) Weight: 99.8 kg (220 lb) IBW/kg (Calculated) : 73 Heparin  Dosing Weight: 93.8 kg  Vital Signs: Temp: 97.9 F (36.6 C) (11/26 0418) Temp Source: Oral (11/26 0418) BP: 122/90 (11/26 0418) Pulse Rate: 65 (11/26 0418)  Labs: Recent Labs    09/08/24 1522 09/09/24 0527  HGB 16.7  --   HCT 48.7  --   PLT 286  --   HEPARINUNFRC  --  0.27*  CREATININE 1.67*  --     Estimated Creatinine Clearance: 54.3 mL/min (A) (by C-G formula based on SCr of 1.67 mg/dL (H)).   Medical History: Past Medical History:  Diagnosis Date   Hypertension    Urticaria     Assessment: 82 yom with a history of HTN, HLD. Patient is presenting with chest pain. Heparin  per pharmacy consult placed for chest pain/ACS. Patient is not on anticoagulation prior to arrival. Hgb 16.7; plt 286  AM: heparin  level slightly below goal; per RN no issues with heparin  gtt running continuously or signs/symptoms of bleeding  Goal of Therapy:  Heparin  level 0.3-0.7 units/ml Monitor platelets by anticoagulation protocol: Yes   Plan:  Increase heparin  infusion to 1400 units/hr Check anti-Xa level in 8 hours and daily while on heparin  Continue to monitor H&H and platelets F/u plans for [facility]  Lynwood Poplar, PharmD, BCPS Clinical Pharmacist 09/09/2024 6:28 AM

## 2024-09-09 NOTE — H&P (View-Only) (Signed)
  Progress Note  Patient Name: Chase Romero Date of Encounter: 09/09/2024 [facility] Cardiologist: None   Interval Summary    Doing well overnight.  Orthopedic note reviewed.  Outpatient management.  His finger is tender.   Vital Signs Vitals:   09/08/24 2230 09/08/24 2347 09/09/24 0418 09/09/24 0823  BP: (!) 146/81 132/89 (!) 122/90 (!) 128/95  Pulse: 83 81 65 60  Resp: 17 16 17 17   Temp:  (!) 97.5 F (36.4 C) 97.9 F (36.6 C) 97.8 F (36.6 C)  TempSrc:  Oral Oral Oral  SpO2: 97% 98% 98% 97%  Weight:      Height:        Intake/Output Summary (Last 24 hours) at 09/09/2024 0911 Last data filed at 09/09/2024 0704 Gross per 24 hour  Intake 240 ml  Output 250 ml  Net -10 ml      09/08/2024    3:03 PM 05/07/2023   10:09 AM 07/23/2018   12:11 AM  Last 3 Weights  Weight (lbs) 220 lb 210 lb 12.8 oz 225 lb  Weight (kg) 99.791 kg 95.618 kg 102.059 kg      Telemetry/ECG  No adverse arrhythmias- Personally Reviewed  Physical Exam  GEN: No acute distress.   Neck: No JVD Cardiac: RRR, no murmurs, rubs, or gallops.  Respiratory: Clear to auscultation bilaterally. GI: Soft, nontender, non-distended  MS: No edema, mildly swollen and deeper color to finger tip with injury, tender  Assessment & Plan   63 year old with non-ST elevation myocardial infarction. -IV heparin  -Metoprolol  25 mg twice a day -Rosuvastatin  20 mg a day -Cardiac catheterization possible PCI -timing around noon today. -Cardiac rehab when able -Echo pending  Strong family history of CAD - Father CABG.  Hyperlipidemia - Crestor  20 LDL goal less than 55 -LP(a) pending -Hemoglobin A1c 5.2  Acute kidney injury - Creatinine 1.67 on arrival, creatinine currently 1.24 after hydration overnight.  Hypertension - We will go ahead after cath and add angiotensin receptor blocker.  Finger injury from pressure washer - Appreciate Dr. Romona.  Continuing to monitor.     For questions  or updates, please contact [facility] Please consult [email or web address] for contact info under         Signed, Oneil Parchment, MD

## 2024-09-09 NOTE — Plan of Care (Signed)
   Problem: Education: Goal: Knowledge of General Education information will improve Description: Including pain rating scale, medication(s)/side effects and non-pharmacologic comfort measures Outcome: Progressing   Problem: Clinical Measurements: Goal: Cardiovascular complication will be avoided Outcome: Progressing

## 2024-09-09 NOTE — Progress Notes (Addendum)
  Progress Note  Patient Name: Chase Romero Date of Encounter: 09/09/2024 [facility] Cardiologist: None   Interval Summary    Doing well overnight.  Orthopedic note reviewed.  Outpatient management.  His finger is tender.   Vital Signs Vitals:   09/08/24 2230 09/08/24 2347 09/09/24 0418 09/09/24 0823  BP: (!) 146/81 132/89 (!) 122/90 (!) 128/95  Pulse: 83 81 65 60  Resp: 17 16 17 17   Temp:  (!) 97.5 F (36.4 C) 97.9 F (36.6 C) 97.8 F (36.6 C)  TempSrc:  Oral Oral Oral  SpO2: 97% 98% 98% 97%  Weight:      Height:        Intake/Output Summary (Last 24 hours) at 09/09/2024 0911 Last data filed at 09/09/2024 0704 Gross per 24 hour  Intake 240 ml  Output 250 ml  Net -10 ml      09/08/2024    3:03 PM 05/07/2023   10:09 AM 07/23/2018   12:11 AM  Last 3 Weights  Weight (lbs) 220 lb 210 lb 12.8 oz 225 lb  Weight (kg) 99.791 kg 95.618 kg 102.059 kg      Telemetry/ECG  No adverse arrhythmias- Personally Reviewed  Physical Exam  GEN: No acute distress.   Neck: No JVD Cardiac: RRR, no murmurs, rubs, or gallops.  Respiratory: Clear to auscultation bilaterally. GI: Soft, nontender, non-distended  MS: No edema, mildly swollen and deeper color to finger tip with injury, tender  Assessment & Plan   63 year old with non-ST elevation myocardial infarction. -IV heparin  -Metoprolol  25 mg twice a day -Rosuvastatin  20 mg a day -Cardiac catheterization possible PCI -timing around noon today. -Cardiac rehab when able -Echo pending  Strong family history of CAD - Father CABG.  Hyperlipidemia - Crestor  20 LDL goal less than 55 -LP(a) pending -Hemoglobin A1c 5.2  Acute kidney injury - Creatinine 1.67 on arrival, creatinine currently 1.24 after hydration overnight.  Hypertension - We will go ahead after cath and add angiotensin receptor blocker.  Finger injury from pressure washer - Appreciate Dr. Romona.  Continuing to monitor.     For questions  or updates, please contact [facility] Please consult [email or web address] for contact info under         Signed, Oneil Parchment, MD

## 2024-09-09 NOTE — Discharge Summary (Addendum)
 Discharge Summary   Patient ID: Chase Romero MRN: [id number]; DOB: 28-Oct-1960  Admit date: 09/08/2024 Discharge date: 09/09/2024  PCP:  [facility] [place]   [facility] Providers Cardiologist:  Oneil Parchment, MD       Discharge Diagnoses  Principal Problem:   NSTEMI (non-ST elevated myocardial infarction) [facility]) Active Problems:   Hypertension   Hyperlipidemia   AKI (acute kidney injury)   Finger injury   Tobacco abuse   Diagnostic Studies/Procedures   LHC 09/09/24  Dominance: Right  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  Diffuse nonobstructive CAD involving the LAD, LCx, and RCA without high-grade obstructive disease Normal LV function with LVEF estimated at 55-60%   Recommend: medical therapy (DAPT x 12 months, high-intensity statin, tobacco cessation). The ramus branch is small in caliber favor med Rx as angiographic appearance suggests < 2 mm vessel size.  Echocardiogram 09/09/24 IMPRESSIONS     1. Left ventricular ejection fraction, by estimation, is 50 to 55%. The  left ventricle has low normal function. The left ventricle has no regional  wall motion abnormalities. There is mild left ventricular hypertrophy.  Left ventricular diastolic  parameters were normal. There is the interventricular septum is flattened  in systole and diastole, consistent with right ventricular pressure and  volume overload.   2. Right ventricular systolic function is mildly reduced. The right  ventricular size is normal. There is normal pulmonary artery systolic  pressure. The estimated right ventricular systolic pressure is 20.6 mmHg.   3. The mitral valve is normal in structure. Trivial mitral valve  regurgitation. No evidence of mitral stenosis.   4. The aortic valve is tricuspid. Aortic valve regurgitation is not  visualized. Aortic valve sclerosis is present, with no evidence of aortic  valve stenosis.   5. The  inferior vena cava is normal in size with greater than 50%  respiratory variability, suggesting right atrial pressure of 3 mmHg    CXR without acute process  XR of left finger without evidence of osseous abnormality  _____________   History of Present Illness   Chase Romero is a 63 y.o. male with a pmhx of hypertension, hyperlipidemia, current alcohol and tobacco use who presented to [facility] for a pressure washer injury to his hand. He then subsequently developed chest pain and shortness of breath. Work up included CBC showed leukocytosis at 12.5, metabolic panel showed creatinine at 1.67, previously noted to be 1.06 in August 2025.  Troponin 85 ? 78.  EKG showed: Sinus tachycardia HR 111, reviewed by Dr. Wonda and concerning for ischemic appearing changes, there are no prior EKGs to compare. He was loaded with ASA and was transferred to [facility] for cardiac catheterization.    Hospital Course   Consultants: Orthopedics  NSTEMI Family history of early CAD in father who is now s/p CABG LHC showed severe stenosis to ramus intermedius with diffuse non-obstructive CAD. Medical therapy was recommended.  Echo as above.  R Radial site stable without tenderness, edema, or ecchymosis. Dressing clean and dry. Radial pulse 2+   Continue ASA 81 mg  Continue Plavix  75 mg  Continue metoprolol  tartrate 25 mg BID Provided with SL NG on discharge with education proper on use.   Risk stratification: A1c 5.2%  Hypertension BP 122/79 on day of discharge  Metoprolol  as above Start losartan  25 mg [reduced PTA dose]  Hyperlipidemia LDL 127   HDL 41 Lipoprotein (a) pending  Continue crestor  20 mg, started this  admission will need lipid panel and LFTs in 6-8 weeks  Tobacco Use  Alcohol Use Cessation was discussed and encouraged this admission.   Recommend PCP follow-up  AKI  Cr on admission 1.67, received IV hydration.  By discharge Cr 1.24 Recommend PCP follow-up with follow up  BMET  Finger injury  Patient was seen by orthopedics for pressure washer injury.  Education was provided to patient via orthopedics on care and signs/symptoms to observe for.  Recommend PCP follow up  Leukocytosis WBC on admission 12.5, most likely reactive. Denied infectious systems and afebrile.  Improved by discharge.  Physical Exam per MD, see progress note from same day Patient examined by Dr. Jeffrie. They were deemed ready for discharge from a cardiac perspective. Follow up appointment is arranged.       Did the patient have an acute coronary syndrome (MI, NSTEMI, STEMI, etc) this admission?:  Yes                               AHA/ACC ACS Clinical Performance & Quality Measures: Aspirin  prescribed? - Yes ADP Receptor Inhibitor (Plavix /Clopidogrel , Brilinta/Ticagrelor or Effient/Prasugrel) prescribed (includes medically managed patients)? - Yes Beta Blocker prescribed? - Yes High Intensity Statin (Lipitor 40-80mg  or Crestor  20-40mg ) prescribed? - Yes EF assessed during THIS hospitalization? - Yes For EF <40%, was ACEI/ARB prescribed? - Yes For EF <40%, Aldosterone Antagonist (Spironolactone or Eplerenone) prescribed? - Not Applicable (EF >/= 40%) Cardiac Rehab Phase II ordered (including medically managed patients)? - Yes       The patient will be scheduled for a TOC follow up appointment in 10-14 days.  A message has been sent to the [facility] and Scheduling Pool at the office where the patient should be seen for follow up.  _____________  Discharge Vitals Blood pressure 127/87, pulse 67, temperature (!) 97.4 F (36.3 C), temperature source Oral, resp. rate 19, height 5' 10 (1.778 m), weight 99.8 kg, SpO2 97%.  Filed Weights   09/08/24 1503  Weight: 99.8 kg    Labs & Radiologic Studies  CBC Recent Labs    09/08/24 1522 09/09/24 0527  WBC 12.5* 9.2  NEUTROABS 9.5*  --   HGB 16.7 14.2  HCT 48.7 42.6  MCV 83.7 86.4  PLT 286 209   Basic Metabolic Panel Recent  Labs    09/08/24 1522 09/09/24 0527  NA 142 137  K 3.7 3.9  CL 105 105  CO2 20* 22  GLUCOSE 88 88  BUN 25* 23  CREATININE 1.67* 1.24  CALCIUM  10.0 8.3*   Liver Function Tests Recent Labs    09/08/24 1522  AST 32  ALT 29  ALKPHOS 86  BILITOT 0.5  PROT 8.0  ALBUMIN 5.1*   Recent Labs  Lab 09/08/24 1601 09/08/24 1801  TRNPT 85* 78*    Hemoglobin A1C Recent Labs    09/08/24 1952  HGBA1C 5.2   Fasting Lipid Panel Recent Labs    09/09/24 0527  CHOL 182  HDL 41  LDLCALC 127*  TRIG 72  CHOLHDL 4.4   No results found for: LIPOA  Thyroid Function Tests Recent Labs    09/08/24 1952  TSH 1.836   _____________  ECHOCARDIOGRAM COMPLETE Result Date: 09/09/2024    ECHOCARDIOGRAM REPORT   Patient Name:   ELIMELECH HOUSEMAN Date of Exam: 09/09/2024 Medical Rec #:  [id number]      Height:       70.0 in Accession #:  [id number]     Weight:       220.0 lb Date of Birth:  10/29/1960       BSA:          2.174 m Patient Age:    63 years       BP:           138/91 mmHg Patient Gender: M              HR:           55 bpm. Exam Location:  Inpatient Procedure: 2D Echo (Both Spectral and Color Flow Doppler were utilized during            procedure). Indications:     NSTEMI  History:         Patient has no prior history of Echocardiogram examinations.  Sonographer:     Charmaine Gaskins Referring Phys:  [id number] UJBONM A PARCELLS Diagnosing Phys: Lonni Nanas MD IMPRESSIONS  1. Left ventricular ejection fraction, by estimation, is 50 to 55%. The left ventricle has low normal function. The left ventricle has no regional wall motion abnormalities. There is mild left ventricular hypertrophy. Left ventricular diastolic parameters were normal. There is the interventricular septum is flattened in systole and diastole, consistent with right ventricular pressure and volume overload.  2. Right ventricular systolic function is mildly reduced. The right ventricular size is normal. There is normal  pulmonary artery systolic pressure. The estimated right ventricular systolic pressure is 20.6 mmHg.  3. The mitral valve is normal in structure. Trivial mitral valve regurgitation. No evidence of mitral stenosis.  4. The aortic valve is tricuspid. Aortic valve regurgitation is not visualized. Aortic valve sclerosis is present, with no evidence of aortic valve stenosis.  5. The inferior vena cava is normal in size with greater than 50% respiratory variability, suggesting right atrial pressure of 3 mmHg. FINDINGS  Left Ventricle: Left ventricular ejection fraction, by estimation, is 50 to 55%. The left ventricle has low normal function. The left ventricle has no regional wall motion abnormalities. The left ventricular internal cavity size was normal in size. There is mild left ventricular hypertrophy. The interventricular septum is flattened in systole and diastole, consistent with right ventricular pressure and volume overload. Left ventricular diastolic parameters were normal. Right Ventricle: The right ventricular size is normal. No increase in right ventricular wall thickness. Right ventricular systolic function is mildly reduced. There is normal pulmonary artery systolic pressure. The tricuspid regurgitant velocity is 2.10 m/s, and with an assumed right atrial pressure of 3 mmHg, the estimated right ventricular systolic pressure is 20.6 mmHg. Left Atrium: Left atrial size was normal in size. Right Atrium: Right atrial size was normal in size. Pericardium: There is no evidence of pericardial effusion. Mitral Valve: The mitral valve is normal in structure. Trivial mitral valve regurgitation. No evidence of mitral valve stenosis. Tricuspid Valve: The tricuspid valve is normal in structure. Tricuspid valve regurgitation is trivial. Aortic Valve: The aortic valve is tricuspid. Aortic valve regurgitation is not visualized. Aortic valve sclerosis is present, with no evidence of aortic valve stenosis. Aortic valve mean  gradient measures 4.0 mmHg. Aortic valve peak gradient measures 7.2  mmHg. Aortic valve area, by VTI measures 3.11 cm. Pulmonic Valve: The pulmonic valve was not well visualized. Pulmonic valve regurgitation is trivial. Aorta: The aortic root and ascending aorta are structurally normal, with no evidence of dilitation. Venous: The inferior vena cava is normal in size with greater than 50% respiratory variability, suggesting right  atrial pressure of 3 mmHg. IAS/Shunts: The atrial septum is grossly normal.  LEFT VENTRICLE PLAX 2D LVIDd:         4.50 cm     Diastology LVIDs:         3.00 cm     LV e' medial:    7.83 cm/s LV PW:         1.10 cm     LV E/e' medial:  11.5 LV IVS:        1.20 cm     LV e' lateral:   10.00 cm/s LVOT diam:     2.30 cm     LV E/e' lateral: 9.0 LV SV:         85 LV SV Index:   39 LVOT Area:     4.15 cm  LV Volumes (MOD) LV vol d, MOD A4C: 84.4 ml LV vol s, MOD A4C: 32.3 ml LV SV MOD A4C:     84.4 ml RIGHT VENTRICLE RV Basal diam:  3.60 cm RV Mid diam:    3.60 cm RV S prime:     8.38 cm/s TAPSE (M-mode): 1.7 cm LEFT ATRIUM             Index        RIGHT ATRIUM           Index LA diam:        3.80 cm 1.75 cm/m   RA Area:     19.00 cm LA Vol (A2C):   65.8 ml 30.27 ml/m  RA Volume:   52.90 ml  24.34 ml/m LA Vol (A4C):   50.3 ml 23.14 ml/m LA Biplane Vol: 60.1 ml 27.65 ml/m  AORTIC VALVE AV Area (Vmax):    3.09 cm AV Area (Vmean):   2.90 cm AV Area (VTI):     3.11 cm AV Vmax:           134.00 cm/s AV Vmean:          95.300 cm/s AV VTI:            0.274 m AV Peak Grad:      7.2 mmHg AV Mean Grad:      4.0 mmHg LVOT Vmax:         99.80 cm/s LVOT Vmean:        66.600 cm/s LVOT VTI:          0.205 m LVOT/AV VTI ratio: 0.75  AORTA Ao Root diam: 3.30 cm Ao Asc diam:  3.60 cm MITRAL VALVE               TRICUSPID VALVE MV Area (PHT): 3.89 cm    TR Peak grad:   17.6 mmHg MV Decel Time: 195 msec    TR Vmax:        210.00 cm/s MV E velocity: 90.10 cm/s MV A velocity: 73.90 cm/s  SHUNTS MV E/A  ratio:  1.22        Systemic VTI:  0.20 m                            Systemic Diam: 2.30 cm Lonni Nanas MD Electronically signed by Lonni Nanas MD Signature Date/Time: 09/09/2024/3:07:27 PM    Final (Updated)    CARDIAC CATHETERIZATION Result Date: 09/09/2024 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 Diffuse nonobstructive CAD involving the LAD, LCx, and RCA without high-grade obstructive disease Normal LV  function with LVEF estimated at 55-60% Recommend: medical therapy (DAPT x 12 months, high-intensity statin, tobacco cessation). The ramus branch is small in caliber favor med Rx as angiographic appearance suggests < 2 mm vessel size.   DG Chest Portable 1 View Result Date: 09/08/2024 CLINICAL DATA:  cp EXAM: PORTABLE CHEST - 1 VIEW COMPARISON:  01/30/2016 FINDINGS: No focal airspace consolidation, pleural effusion, or pneumothorax. No cardiomegaly. Tortuous aorta with aortic atherosclerosis. No acute fracture or destructive lesions. Multilevel thoracic osteophytosis. IMPRESSION: No acute cardiopulmonary abnormality. Electronically Signed   By: Rogelia Myers M.D.   On: 09/08/2024 17:15   DG Finger Ring Left Result Date: 09/08/2024 CLINICAL DATA:  injury. EXAM: LEFT RING FINGER 2+V COMPARISON:  None Available. FINDINGS: No acute fracture or dislocation. No aggressive osseous lesion. No significant degenerative changes of imaged joints. No radiopaque foreign bodies. Soft tissues are within normal limits. IMPRESSION: *No acute osseous abnormality of the left ring finger. Electronically Signed   By: Ree Molt M.D.   On: 09/08/2024 15:53    Disposition Pt is being discharged home today in good condition.  Follow-up Plans & Appointments  Follow-up Information     [facility], [place]. Call in 1 week(s).   Why: Please see your PCP in one week to follow up on your hospital stay to repeat blood work to check your kidney function  and to evaluate your finger injury. Contact information: [address] [facility] [place] [address] [phone]                Discharge Instructions     AMB referral to Phase II Cardiac Rehabilitation   Complete by: As directed    Diagnosis: NSTEMI   After initial evaluation and assessments completed: Virtual Based Care may be provided alone or in conjunction with Phase 2 Cardiac Rehab based on patient barriers.: Yes   Intensive Cardiac Rehabilitation (ICR) [facility] location only OR Traditional Cardiac Rehabilitation (TCR) *If criteria for ICR are not met will enroll in TCR [facility] only): Yes   Call MD for:  redness, tenderness, or signs of infection (pain, swelling, redness, odor or green/yellow discharge around incision site)   Complete by: As directed    Diet - low sodium heart healthy   Complete by: As directed    Discharge wound care:   Complete by: As directed    Finger injury: Keep dressing clean and dry. Do not submerge injury in water . Follow up with PCP. If noticing redness, warmth, and severe pain not relieved by tylenol  would seek medical attention.   Increase activity slowly   Complete by: As directed    PLEASE DO NOT MISS ANY DOSES OF YOUR PLAVIX !!!!! Also keep a log of you blood pressures and bring back to your follow up appt. Please call the office with any questions.   Patients taking blood thinners should generally stay away from medicines like ibuprofen , Advil , Motrin , naproxen, and Aleve due to risk of stomach bleeding. You may take Tylenol  as directed or talk to your primary doctor about alternatives.  PLEASE ENSURE THAT YOU DO NOT RUN OUT OF YOUR PLAVIX . This medication is very important to remain on for at least one year. IF you have issues obtaining this medication due to cost please CALL the office 3-5 business days prior to running out in order to prevent missing doses of this medication.  Radial Site Care Refer to this sheet in the next few  weeks. These instructions provide you with information on caring for yourself after  your procedure. Your caregiver may also give you more specific instructions. Your treatment has been planned according to current medical practices, but problems sometimes occur. Call your caregiver if you have any problems or questions after your procedure. HOME CARE INSTRUCTIONS You may shower the day after the procedure. Remove the bandage (dressing) and gently wash the site with plain soap and water . Gently pat the site dry.  Do not apply powder or lotion to the site.  Do not submerge the affected site in water  for 3 to 5 days.  Inspect the site at least twice daily.  Do not flex or bend the affected arm for 24 hours.  No lifting over 5 pounds (2.3 kg) for 5 days after your procedure.  Do not drive home if you are discharged the same day of the procedure. Have someone else drive you.  You may drive 24 hours after the procedure unless otherwise instructed by your caregiver.  What to expect: Any bruising will usually fade within 1 to 2 weeks.  Blood that collects in the tissue (hematoma) may be painful to the touch. It should usually decrease in size and tenderness within 1 to 2 weeks.  SEEK IMMEDIATE MEDICAL CARE IF: You have unusual pain at the radial site.  You have redness, warmth, swelling, or pain at the radial site.  You have drainage (other than a small amount of blood on the dressing).  You have chills.  You have a fever or persistent symptoms for more than 72 hours.  You have a fever and your symptoms suddenly get worse.  Your arm becomes pale, cool, tingly, or numb.  You have heavy bleeding from the site. Hold pressure on the site.       Discharge Medications Allergies as of 09/09/2024       Reactions   Gabapentin    Lisinopril Other (See Comments)        Medication List     STOP taking these medications    hydrochlorothiazide 25 MG tablet Commonly known as: HYDRODIURIL    ibuprofen  600 MG tablet Commonly known as: ADVIL        TAKE these medications    aspirin  EC 81 MG tablet Take 1 tablet (81 mg total) by mouth daily. Swallow whole. Start taking on: September 10, 2024   betamethasone dipropionate 0.05 % cream Apply topically.   clopidogrel  75 MG tablet Commonly known as: PLAVIX  Take 1 tablet (75 mg total) by mouth daily. Start taking on: September 10, 2024   cyclobenzaprine 10 MG tablet Commonly known as: FLEXERIL Take 5-10 mg by mouth daily as needed for muscle spasms.   DRY EYE RELIEF OP Place 1 drop into both eyes daily.   losartan  25 MG tablet Commonly known as: COZAAR  Take 1 tablet (25 mg total) by mouth daily. What changed:  medication strength how much to take   metoprolol  tartrate 25 MG tablet Commonly known as: LOPRESSOR  Take 1 tablet (25 mg total) by mouth 2 (two) times daily.   nitroGLYCERIN  0.4 MG SL tablet Commonly known as: NITROSTAT  Place 1 tablet (0.4 mg total) under the tongue every 5 (five) minutes x 3 doses as needed for chest pain.   QUEtiapine 100 MG tablet Commonly known as: SEROQUEL Take 50-100 mg by mouth at bedtime.   rosuvastatin  20 MG tablet Commonly known as: CRESTOR  Take 1 tablet (20 mg total) by mouth daily. Start taking on: September 10, 2024   terbinafine 1 % cream Commonly known as: LAMISIL Apply 1 Application  topically daily as needed (rash).   traMADol 50 MG tablet Commonly known as: ULTRAM Take 50 mg by mouth every 6 (six) hours as needed for moderate pain (pain score 4-6).   triamcinolone  ointment 0.1 % Commonly known as: KENALOG  Apply 1 Application topically 2 (two) times daily as needed (rash flare). Do not use on the face, neck, armpits or groin area. Do not use more than 3 weeks in a row.               Discharge Care Instructions  (From admission, onward)           Start     Ordered   09/09/24 0000  Discharge wound care:       Comments: Finger injury: Keep dressing  clean and dry. Do not submerge injury in water . Follow up with PCP. If noticing redness, warmth, and severe pain not relieved by tylenol  would seek medical attention.   09/09/24 1348             Outstanding Labs/Studies Repeat BMET in one week Repeat lipid panel/LFTs in 6-8 weeks  Duration of Discharge Encounter: APP Time: 25 minutes   Signed, Leontine LOISE Salen, PA-C 09/09/2024, 3:32 PM   Personally seen and examined. Agree with above.  63 year old with mild non-ST elevation myocardial infarction with troponin elevation of approximately 85 with ST segment changes in the inferior leads, fleeting chest pain after incident with pressure hose on finger.  He is a smoker, father had CABG, has hypertension and hyperlipidemia.  Cardiac catheterization revealed significant stenosis in small ramus branch, likely plaque rupture.  Treated medically.  Vessel too small for stenting.  Echocardiogram showed ejection fraction of 55% with mild left ventricular hypertrophy and right ventricular function minimally reduced.  Estimated pulmonary pressures were normal but there was some evidence of interventricular septal flattening.  This could be attributed to his years of smoking and possibly underlying chronic lung disease.  He is not currently short of breath.  Finger injury left assessed by orthopedics.  Appreciate their consultation.  Instructions given.  Cath site clean dry and intact.  Crestor  20 mg, Plavix  75 mg, aspirin  81 mg, metoprolol  tartrate 25 mg twice a day, losartan  25 mg utilized.  Continue.  We will see in follow-up.  Time spent 30 min with the patient, review of coronary angiogram, discussion with Dr. Wonda, review of echocardiogram, review of labs and other data.  Oneil Parchment, MD

## 2024-09-09 NOTE — Interval H&P Note (Signed)
 History and Physical Interval Note:  09/09/2024 10:14 AM  Chase Romero  has presented today for surgery, with the diagnosis of NSTEMI.  The various methods of treatment have been discussed with the patient and family. After consideration of risks, benefits and other options for treatment, the patient has consented to  Procedure(s): LEFT HEART CATH AND CORONARY ANGIOGRAPHY (N/A) as a surgical intervention.  The patient's history has been reviewed, patient examined, no change in status, stable for surgery.  I have reviewed the patient's chart and labs.  Questions were answered to the patient's satisfaction.     Chase Romero

## 2024-09-10 ENCOUNTER — Encounter (HOSPITAL_COMMUNITY): Payer: Self-pay | Admitting: Cardiovascular Disease

## 2024-09-10 LAB — LIPOPROTEIN A (LPA): Lipoprotein (a): <8.4 nmol/L (ref <75.0)

## 2024-09-13 ENCOUNTER — Ambulatory Visit: Payer: Self-pay | Admitting: Cardiology

## 2024-09-14 ENCOUNTER — Other Ambulatory Visit (HOSPITAL_COMMUNITY): Payer: Self-pay | Admitting: *Deleted

## 2024-09-14 ENCOUNTER — Telehealth (HOSPITAL_COMMUNITY): Payer: Self-pay | Admitting: *Deleted

## 2024-09-14 DIAGNOSIS — I214 Non-ST elevation (NSTEMI) myocardial infarction: Secondary | ICD-10-CM

## 2024-09-14 NOTE — Telephone Encounter (Signed)
 Pt discharged from [facility] without receiving CR education. Called pt and discussed MI, smoking cessation, diet, exercise, NTG and CRPII. Pt receptive. He has only smoked 2 cigarettes. Encouraged complete cessation and gave resources. Will refer to [facility] however pt sts he starts a new job 12/15.  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 Aliene Aris BS, ACSM-CEP 09/14/2024 11:06 AM

## 2024-09-15 ENCOUNTER — Encounter: Payer: Self-pay | Admitting: Cardiology

## 2024-09-15 ENCOUNTER — Telehealth (HOSPITAL_COMMUNITY): Payer: Self-pay

## 2024-09-15 NOTE — Telephone Encounter (Signed)
 Per [facility], faxed referral to [facility]

## 2024-09-22 NOTE — Progress Notes (Unsigned)
 Cardiology Office Note   Date:  09/22/2024  ID:  Chase Romero, DOB [birth year], MRN [id number] PCP: [facility] Providers Cardiologist:  Oneil Parchment, MD   History of Present Illness Chase Romero is a 63 y.o. male with a past medical history of hypertension, hyperlipidemia, current alcohol and tobacco use who was seen in the hospital for evaluation of chest pain 09/08/2024.  He presented to the emergency department at [facility] on 11/25 after concerns for a pressure washer injury to his hand.  He reported that he had been pressure washing for about 5 hours when the pressure washer hit his left hand causing a small wound.  During this injury he developed sudden onset of chest pain and shortness of breath.  He thought originally it was due to anxiety due to the pain in his finger.  Denies associated nausea, vomiting, diaphoresis.  He did note feeling lightheaded however he attributed this to the pain in his finger.  While in the ED lab work showed CBC with mild leukocytosis 12.5, metabolic panel showed creatinine of 1.67, previously noted to be 1.06 in August 2025.  Troponin 85 down to 78.  EKG showed sinus tachycardia heart rate 111 reviewed by Dr. Wonda concerning for ischemic appearing changes.  No prior EKG for comparison.  Patient was loaded with aspirin  324 mg, case discussed with on-call STEMI doctor who determined it was not a code STEMI but did recommend transfer to [facility] emergency department for possible cardiac catheterization.  Upon speaking with the patient he admitted to having hypertension, hyperlipidemia, and using both alcohol and tobacco products.  Denied any sort of ischemic workup in the past.  Reports history of coronary vascular disease with his father reporting that he died from this.  We discussed cardiac catheterization and patient underwent this 09/09/2024 and it was determined that he had severe stenosis of a small  caliber ramus intermedius branch suspected to be the culprit lesion for ACS.  Diffuse nonobstructive CAD involving the LAD, LCx, and RCA without high-grade obstructive disease.  Normal LVEF and recommended medical therapy with DAPT x 12 months, high intensity statin, and tobacco cessation.  Today, he***  ROS: ***  Studies Reviewed     Cardiac catheterization 09/09/2024  Left Anterior Descending  There is mild diffuse disease throughout the vessel. The LAD has diffuse plaquing without high-grade obstruction. The LAD and diagonal have 40% stenoses. There is no high-grade obstructive disease throughout the LAD distribution.  Prox LAD to Mid LAD lesion is 40% stenosed.    First Diagonal Branch  There is mild disease in the vessel.    Ramus Intermedius  Vessel is small. The ramus intermedius branch is small caliber. It is overlapped on most views but in the mid vessel there is a 90% eccentric stenosis. I suspect this is the patient's culprit lesion.  Ramus lesion is 90% stenosed.    Left Circumflex  The circumflex is patent throughout. The vessel has 40% proximal stenosis. The first OM has mild ectasia and is patent with diffuse irregularity. There are no high-grade obstructive lesions in the left circumflex distribution.  Prox Cx lesion is 40% stenosed.    Right Coronary Artery  Vessel is large. There is mild diffuse disease throughout the vessel. The vessel is moderately calcified. The vessel is ectatic. Large, dominant vessel. There is diffuse irregularity and mild ectasia in the mid vessel and distal vessel. The PDA and PLA branches are patent and are also  diffusely calcified.    Intervention   No interventions have been documented.   Left Heart  Left Ventricle The left ventricular size is normal. The left ventricular systolic function is normal. LV end diastolic pressure is normal. The left ventricular ejection fraction is 55-65% by visual estimate. No regional wall motion  abnormalities.   Coronary Diagrams  Diagnostic Dominance: Right  Risk Assessment/Calculations {Does this patient have ATRIAL FIBRILLATION?[phone]} No BP recorded.  {Refresh Note OR Click here to enter BP  :1}***       Physical Exam VS:  There were no vitals taken for this visit.       Wt Readings from Last 3 Encounters:  09/08/24 220 lb (99.8 kg)  05/07/23 210 lb 12.8 oz (95.6 kg)  07/23/18 225 lb (102.1 kg)    GEN: Well nourished, well developed in no acute distress NECK: No JVD; No carotid bruits CARDIAC: ***RRR, no murmurs, rubs, gallops RESPIRATORY:  Clear to auscultation without rales, wheezing or rhonchi  ABDOMEN: Soft, non-tender, non-distended EXTREMITIES:  No edema; No deformity   ASSESSMENT AND PLAN CAD Hypertension Hyperlipidemia Tobacco use Alcohol use    {Are you ordering a CV Procedure (e.g. stress test, cath, DCCV, TEE, etc)?   Press F2        :[id number]}  Dispo: ***  Signed, Orren LOISE Fabry, PA-C

## 2024-09-24 ENCOUNTER — Ambulatory Visit: Payer: Self-pay | Attending: Physician Assistant | Admitting: Physician Assistant

## 2024-09-24 ENCOUNTER — Encounter: Payer: Self-pay | Admitting: Physician Assistant

## 2024-09-24 VITALS — BP 124/80 | HR 50 | Ht 70.0 in | Wt 227.2 lb

## 2024-09-24 DIAGNOSIS — E785 Hyperlipidemia, unspecified: Secondary | ICD-10-CM

## 2024-09-24 DIAGNOSIS — I2510 Atherosclerotic heart disease of native coronary artery without angina pectoris: Secondary | ICD-10-CM

## 2024-09-24 DIAGNOSIS — Z720 Tobacco use: Secondary | ICD-10-CM

## 2024-09-24 DIAGNOSIS — I10 Essential (primary) hypertension: Secondary | ICD-10-CM

## 2024-09-24 DIAGNOSIS — F1010 Alcohol abuse, uncomplicated: Secondary | ICD-10-CM

## 2024-09-24 MED ORDER — ROSUVASTATIN CALCIUM 20 MG PO TABS: 20.0000 mg | ORAL_TABLET | Freq: Every day | ORAL | 3 refills | Status: AC

## 2024-09-24 MED ORDER — METOPROLOL SUCCINATE ER 25 MG PO TB24: 25.0000 mg | ORAL_TABLET | Freq: Every day | ORAL | 3 refills | Status: AC

## 2024-09-24 MED ORDER — CLOPIDOGREL BISULFATE 75 MG PO TABS: 75.0000 mg | ORAL_TABLET | Freq: Every day | ORAL | 3 refills | Status: AC

## 2024-09-24 MED ORDER — LOSARTAN POTASSIUM 25 MG PO TABS: 25.0000 mg | ORAL_TABLET | Freq: Every day | ORAL | 3 refills | Status: AC

## 2024-09-24 NOTE — Patient Instructions (Addendum)
 Thank you for choosing [facility]!     Medication Instructions:  Start Metoprolol  Succinate 25mg . Take one tablet daily.  Your refills have been sent to your pharmacy for all your heart care medication. *If you need a refill on your cardiac medications before your next appointment, please call your pharmacy*   Lab Work: No labs were ordered during today's visit.  If you have labs (blood work) drawn today and your tests are completely normal, you will receive your results only by: MyChart Message (if you have MyChart) OR A paper copy in the mail If you have any lab test that is abnormal or we need to change your treatment, we will call you to review the results.   Testing/Procedures: No procedures were ordered during today's visit.   Your next appointment:   1 year(s)   Provider:   Oneil Parchment, MD     Follow-Up: At [facility], you and your health needs are our priority.  As part of our continuing mission to provide you with exceptional heart care, we have created designated Provider Care Teams.  These Care Teams include your primary Cardiologist (physician) and Advanced Practice Providers (APPs -  Physician Assistants and Nurse Practitioners) who all work together to provide you with the care you need, when you need it. We recommend signing up for the patient portal called MyChart.  Sign up information is provided on this After Visit Summary.  MyChart is used to connect with patients for Virtual Visits (Telemedicine).  Patients are able to view lab/test results, encounter notes, upcoming appointments, etc.  Non-urgent messages can be sent to your provider as well.   To learn more about what you can do with MyChart, go to [email or web address].
